# Patient Record
Sex: Female | Born: 1960 | Race: Black or African American | Hispanic: No | Marital: Married | Smoking: Current every day smoker
Health system: Southern US, Community
[De-identification: ages and names within clinical notes are randomized; demographics above are authoritative.]

## PROBLEM LIST (undated history)

## (undated) DIAGNOSIS — I10 Essential (primary) hypertension: Secondary | ICD-10-CM

## (undated) DIAGNOSIS — G629 Polyneuropathy, unspecified: Secondary | ICD-10-CM

## (undated) DIAGNOSIS — G8929 Other chronic pain: Secondary | ICD-10-CM

## (undated) DIAGNOSIS — E119 Type 2 diabetes mellitus without complications: Secondary | ICD-10-CM

## (undated) HISTORY — PX: SHOULDER SURGERY: SHX246

## (undated) HISTORY — PX: CHOLECYSTECTOMY: SHX55

## (undated) HISTORY — PX: LEEP: SHX91

---

## 2017-02-13 ENCOUNTER — Emergency Department (HOSPITAL_BASED_OUTPATIENT_CLINIC_OR_DEPARTMENT_OTHER): Payer: Self-pay

## 2017-02-13 ENCOUNTER — Encounter (HOSPITAL_BASED_OUTPATIENT_CLINIC_OR_DEPARTMENT_OTHER): Payer: Self-pay | Admitting: Emergency Medicine

## 2017-02-13 ENCOUNTER — Observation Stay (HOSPITAL_BASED_OUTPATIENT_CLINIC_OR_DEPARTMENT_OTHER)
Admission: EM | Admit: 2017-02-13 | Discharge: 2017-02-14 | Disposition: A | Discharge status: Home / Self Care | Payer: Self-pay | Attending: Internal Medicine | Admitting: Internal Medicine

## 2017-02-13 DIAGNOSIS — F17200 Nicotine dependence, unspecified, uncomplicated: Secondary | ICD-10-CM | POA: Insufficient documentation

## 2017-02-13 DIAGNOSIS — R062 Wheezing: Secondary | ICD-10-CM

## 2017-02-13 DIAGNOSIS — E119 Type 2 diabetes mellitus without complications: Secondary | ICD-10-CM

## 2017-02-13 DIAGNOSIS — Z79899 Other long term (current) drug therapy: Secondary | ICD-10-CM | POA: Insufficient documentation

## 2017-02-13 DIAGNOSIS — I10 Essential (primary) hypertension: Secondary | ICD-10-CM | POA: Insufficient documentation

## 2017-02-13 DIAGNOSIS — E781 Pure hyperglyceridemia: Secondary | ICD-10-CM | POA: Insufficient documentation

## 2017-02-13 DIAGNOSIS — J209 Acute bronchitis, unspecified: Secondary | ICD-10-CM | POA: Insufficient documentation

## 2017-02-13 DIAGNOSIS — Z791 Long term (current) use of non-steroidal anti-inflammatories (NSAID): Secondary | ICD-10-CM | POA: Insufficient documentation

## 2017-02-13 DIAGNOSIS — E1165 Type 2 diabetes mellitus with hyperglycemia: Principal | ICD-10-CM | POA: Insufficient documentation

## 2017-02-13 DIAGNOSIS — N289 Disorder of kidney and ureter, unspecified: Secondary | ICD-10-CM | POA: Insufficient documentation

## 2017-02-13 DIAGNOSIS — R739 Hyperglycemia, unspecified: Secondary | ICD-10-CM | POA: Diagnosis present

## 2017-02-13 DIAGNOSIS — E1140 Type 2 diabetes mellitus with diabetic neuropathy, unspecified: Secondary | ICD-10-CM | POA: Insufficient documentation

## 2017-02-13 DIAGNOSIS — B3781 Candidal esophagitis: Secondary | ICD-10-CM

## 2017-02-13 DIAGNOSIS — J069 Acute upper respiratory infection, unspecified: Secondary | ICD-10-CM

## 2017-02-13 DIAGNOSIS — E871 Hypo-osmolality and hyponatremia: Secondary | ICD-10-CM | POA: Insufficient documentation

## 2017-02-13 HISTORY — DX: Type 2 diabetes mellitus without complications: E11.9

## 2017-02-13 HISTORY — DX: Polyneuropathy, unspecified: G62.9

## 2017-02-13 HISTORY — DX: Essential (primary) hypertension: I10

## 2017-02-13 LAB — CBG MONITORING, ED
GLUCOSE-CAPILLARY: 576 mg/dL — AB (ref 65–99)
GLUCOSE-CAPILLARY: 466 mg/dL — AB (ref 65–99)
Glucose-Capillary: 565 mg/dL (ref 65–99)

## 2017-02-13 LAB — URINALYSIS, ROUTINE W REFLEX MICROSCOPIC
BILIRUBIN URINE: NEGATIVE
Glucose, UA: >=500 mg/dL — AB
KETONES UR: 15 mg/dL — AB
Leukocytes, UA: NEGATIVE
NITRITE: NEGATIVE
PROTEIN: NEGATIVE mg/dL
Specific Gravity, Urine: <1.005 — ABNORMAL LOW (ref 1.005–1.030)
pH: 5.5 (ref 5.0–8.0)

## 2017-02-13 LAB — URINALYSIS, MICROSCOPIC (REFLEX)

## 2017-02-13 LAB — RAPID STREP SCREEN (MED CTR MEBANE ONLY): Streptococcus, Group A Screen (Direct): NEGATIVE

## 2017-02-13 LAB — CBC WITH DIFFERENTIAL/PLATELET
BASOS PCT: 0 %
Basophils Absolute: 0 10*3/uL (ref 0.0–0.1)
EOS ABS: 0.1 10*3/uL (ref 0.0–0.7)
EOS PCT: 1 %
HCT: 43.2 % (ref 36.0–46.0)
Hemoglobin: 14.5 g/dL (ref 12.0–15.0)
Lymphocytes Relative: 39 %
Lymphs Abs: 3 10*3/uL (ref 0.7–4.0)
MCH: 30.6 pg (ref 26.0–34.0)
MCHC: 33.6 g/dL (ref 30.0–36.0)
MCV: 91.1 fL (ref 78.0–100.0)
MONO ABS: 0.5 10*3/uL (ref 0.1–1.0)
MONOS PCT: 6 %
Neutro Abs: 4.2 10*3/uL (ref 1.7–7.7)
Neutrophils Relative %: 54 %
PLATELETS: 203 10*3/uL (ref 150–400)
RBC: 4.74 MIL/uL (ref 3.87–5.11)
RDW: 12.3 % (ref 11.5–15.5)
WBC: 7.7 10*3/uL (ref 4.0–10.5)

## 2017-02-13 LAB — BASIC METABOLIC PANEL
Anion gap: 15 (ref 5–15)
BUN: 25 mg/dL — AB (ref 6–20)
CO2: 26 mmol/L (ref 22–32)
CREATININE: 1.3 mg/dL — AB (ref 0.44–1.00)
Calcium: 9.7 mg/dL (ref 8.9–10.3)
Chloride: 88 mmol/L — ABNORMAL LOW (ref 101–111)
GFR, EST AFRICAN AMERICAN: 53 mL/min — AB (ref >60)
GFR, EST NON AFRICAN AMERICAN: 45 mL/min — AB (ref >60)
Glucose, Bld: 741 mg/dL (ref 65–99)
Potassium: 4 mmol/L (ref 3.5–5.1)
SODIUM: 129 mmol/L — AB (ref 135–145)

## 2017-02-13 LAB — GLUCOSE, CAPILLARY
GLUCOSE-CAPILLARY: 394 mg/dL — AB (ref 65–99)
GLUCOSE-CAPILLARY: 271 mg/dL — AB (ref 65–99)

## 2017-02-13 LAB — MRSA PCR SCREENING: MRSA BY PCR: NEGATIVE

## 2017-02-13 MED ORDER — AZITHROMYCIN 250 MG PO TABS
500.0000 mg | ORAL_TABLET | Freq: Every day | ORAL | Status: AC
  Administered 2017-02-13: 500 mg via ORAL
  Filled 2017-02-13: qty 2

## 2017-02-13 MED ORDER — ACETAMINOPHEN 500 MG PO TABS
1000.0000 mg | ORAL_TABLET | Freq: Once | ORAL | Status: AC
  Administered 2017-02-13: 1000 mg via ORAL
  Filled 2017-02-13: qty 2

## 2017-02-13 MED ORDER — SODIUM CHLORIDE 0.9 % IV BOLUS (SEPSIS)
1000.0000 mL | Freq: Once | INTRAVENOUS | Status: AC
  Administered 2017-02-13: 1000 mL via INTRAVENOUS

## 2017-02-13 MED ORDER — SODIUM CHLORIDE 0.9 % IV SOLN
INTRAVENOUS | Status: DC
  Administered 2017-02-13: 5.2 [IU]/h via INTRAVENOUS
  Filled 2017-02-13 (×2): qty 1

## 2017-02-13 MED ORDER — CHLORHEXIDINE GLUCONATE 0.12 % MT SOLN
15.0000 mL | Freq: Two times a day (BID) | OROMUCOSAL | Status: DC
  Administered 2017-02-13 – 2017-02-14 (×2): 15 mL via OROMUCOSAL
  Filled 2017-02-13 (×2): qty 15

## 2017-02-13 MED ORDER — AZITHROMYCIN 250 MG PO TABS
250.0000 mg | ORAL_TABLET | Freq: Every day | ORAL | Status: DC
  Administered 2017-02-14: 250 mg via ORAL
  Filled 2017-02-13: qty 1

## 2017-02-13 MED ORDER — ENOXAPARIN SODIUM 40 MG/0.4ML ~~LOC~~ SOLN
40.0000 mg | SUBCUTANEOUS | Status: DC
  Administered 2017-02-13: 40 mg via SUBCUTANEOUS
  Filled 2017-02-13: qty 0.4

## 2017-02-13 MED ORDER — ORAL CARE MOUTH RINSE: 15.0000 mL | Freq: Two times a day (BID) | OROMUCOSAL | Status: DC

## 2017-02-13 MED ORDER — FLUCONAZOLE 100 MG PO TABS: 150.0000 mg | ORAL_TABLET | Freq: Once | ORAL | Status: DC

## 2017-02-13 MED ORDER — HYDROCODONE-ACETAMINOPHEN 7.5-325 MG PO TABS: 1.0000 | ORAL_TABLET | Freq: Four times a day (QID) | ORAL | Status: DC | PRN

## 2017-02-13 MED ORDER — METHYLPREDNISOLONE SODIUM SUCC 125 MG IJ SOLR
125.0000 mg | Freq: Once | INTRAMUSCULAR | Status: AC
  Administered 2017-02-13: 125 mg via INTRAVENOUS
  Filled 2017-02-13: qty 2

## 2017-02-13 MED ORDER — GLIMEPIRIDE 2 MG PO TABS
2.0000 mg | ORAL_TABLET | Freq: Every day | ORAL | Status: DC
  Administered 2017-02-14: 2 mg via ORAL
  Filled 2017-02-13: qty 1

## 2017-02-13 MED ORDER — DEXTROSE-NACL 5-0.45 % IV SOLN: INTRAVENOUS | Status: DC

## 2017-02-13 MED ORDER — SODIUM CHLORIDE 0.9 % IV SOLN
INTRAVENOUS | Status: DC
  Administered 2017-02-13: 20:00:00 via INTRAVENOUS
  Administered 2017-02-14: 125 mL/h via INTRAVENOUS

## 2017-02-13 MED ORDER — ONDANSETRON HCL 4 MG/2ML IJ SOLN
4.0000 mg | INTRAMUSCULAR | Status: AC
  Administered 2017-02-13: 4 mg via INTRAVENOUS
  Filled 2017-02-13: qty 2

## 2017-02-13 MED ORDER — ACETAMINOPHEN 325 MG PO TABS: 650.0000 mg | ORAL_TABLET | Freq: Four times a day (QID) | ORAL | Status: DC | PRN

## 2017-02-13 MED ORDER — GUAIFENESIN-CODEINE 100-10 MG/5ML PO SOLN
10.0000 mL | Freq: Once | ORAL | Status: AC
  Administered 2017-02-13: 10 mL via ORAL
  Filled 2017-02-13: qty 10

## 2017-02-13 MED ORDER — INSULIN ASPART 100 UNIT/ML ~~LOC~~ SOLN
0.0000 [IU] | Freq: Three times a day (TID) | SUBCUTANEOUS | Status: DC
  Administered 2017-02-14: 7 [IU] via SUBCUTANEOUS
  Administered 2017-02-14: 3 [IU] via SUBCUTANEOUS

## 2017-02-13 MED ORDER — INSULIN ASPART 100 UNIT/ML ~~LOC~~ SOLN: 15.0000 [IU] | Freq: Once | SUBCUTANEOUS | Status: DC

## 2017-02-13 MED ORDER — MOMETASONE FURO-FORMOTEROL FUM 200-5 MCG/ACT IN AERO
2.0000 | INHALATION_SPRAY | Freq: Two times a day (BID) | RESPIRATORY_TRACT | Status: DC
  Administered 2017-02-13: 2 via RESPIRATORY_TRACT
  Filled 2017-02-13: qty 8.8

## 2017-02-13 MED ORDER — METHOCARBAMOL 500 MG PO TABS
500.0000 mg | ORAL_TABLET | Freq: Four times a day (QID) | ORAL | Status: DC
  Administered 2017-02-13 – 2017-02-14 (×2): 500 mg via ORAL
  Filled 2017-02-13 (×2): qty 1

## 2017-02-13 MED ORDER — MAGNESIUM SULFATE 2 GM/50ML IV SOLN
2.0000 g | Freq: Once | INTRAVENOUS | Status: AC
  Administered 2017-02-13: 2 g via INTRAVENOUS
  Filled 2017-02-13: qty 50

## 2017-02-13 MED ORDER — GABAPENTIN 300 MG PO CAPS
300.0000 mg | ORAL_CAPSULE | Freq: Three times a day (TID) | ORAL | Status: DC
Start: 2017-02-13 — End: 2017-02-14
  Administered 2017-02-13 – 2017-02-14 (×2): 300 mg via ORAL
  Filled 2017-02-13 (×2): qty 1

## 2017-02-13 MED ORDER — IPRATROPIUM BROMIDE 0.02 % IN SOLN
0.5000 mg | Freq: Once | RESPIRATORY_TRACT | Status: AC
  Administered 2017-02-13: 0.5 mg via RESPIRATORY_TRACT
  Filled 2017-02-13: qty 2.5

## 2017-02-13 MED ORDER — SODIUM CHLORIDE 0.9 % IV SOLN
INTRAVENOUS | Status: DC
  Filled 2017-02-13: qty 1

## 2017-02-13 MED ORDER — ALBUTEROL SULFATE (2.5 MG/3ML) 0.083% IN NEBU
5.0000 mg | INHALATION_SOLUTION | Freq: Once | RESPIRATORY_TRACT | Status: AC
  Administered 2017-02-13: 5 mg via RESPIRATORY_TRACT
  Filled 2017-02-13: qty 6

## 2017-02-13 MED ORDER — ACETAMINOPHEN 650 MG RE SUPP: 650.0000 mg | Freq: Four times a day (QID) | RECTAL | Status: DC | PRN

## 2017-02-13 MED ORDER — ALBUTEROL SULFATE (2.5 MG/3ML) 0.083% IN NEBU
2.5000 mg | INHALATION_SOLUTION | Freq: Four times a day (QID) | RESPIRATORY_TRACT | Status: DC | PRN
Start: 2017-02-13 — End: 2017-02-14

## 2017-02-13 MED ORDER — LISINOPRIL 10 MG PO TABS
10.0000 mg | ORAL_TABLET | Freq: Every day | ORAL | Status: DC
  Administered 2017-02-14: 10 mg via ORAL
  Filled 2017-02-13: qty 1

## 2017-02-13 MED ORDER — METFORMIN HCL ER 500 MG PO TB24
500.0000 mg | ORAL_TABLET | Freq: Every day | ORAL | Status: DC
  Administered 2017-02-14: 500 mg via ORAL
  Filled 2017-02-13: qty 1

## 2017-02-13 NOTE — ED Notes (Signed)
Patient transported to X-ray 

## 2017-02-13 NOTE — ED Notes (Signed)
Glucose 741, results given to ED MD

## 2017-02-13 NOTE — ED Notes (Signed)
Pt on auto VS  

## 2017-02-13 NOTE — H&P (Signed)
TRH H&P   Patient Demographics:    Michelle Hess, is a 56 y.o. female  MRN: [id number]   DOB - 1960/10/25  Admit Date - 02/13/2017  Outpatient Primary MD for the patient is Patient, No Pcp Per  Referring MD/NP/PA: Arthor Captain  Outpatient Specialists: none  Patient [facility] from: [facility]  Chief Complaint  Patient presents with  . Sore Throat  . Cough      HPI:    Michelle Hess  is a 56 y.o. female,  w hx of bronchitis on dulera  Apparently c/o cough w yellow sputum x5 days. And slight dyspnea.  Denies fever, chills, cp, palp, n/v, diarrhea, brbpr.  [facility] to [facility].   In ED,  CXR negative Wbc 7.7, Hgb 14.5,  Bun 25, Creatinine 1.30 Glucose 741 Hco3 26.  ua few ketones 15.   Pt will be admitted for new onset diabetes and bronchitis.      Review of systems:    In addition to the HPI above,  No Fever-chills, No Headache, No changes with Vision or hearing, No problems swallowing food or Liquids, No Chest pain,  No Abdominal pain, No Nausea or Vommitting, Bowel movements are regular, No Blood in stool or Urine, No dysuria, No new skin rashes or bruises, No new joints pains-aches,  No new weakness, tingling, numbness in any extremity, No recent weight gain or loss, No polyuria, polydypsia or polyphagia, No significant Mental Stressors.  A full 10 point Review of Systems was done, except as stated above, all other Review of Systems were negative.   With Past History of the following :    Past Medical History:  Diagnosis Date  . Diabetes (HCC)   . Hypertension   . Neuropathy       History reviewed. No pertinent surgical history.    Social History:     Social History  Substance Use Topics  . Smoking status: Current Every Day Smoker  . Smokeless tobacco: Never Used  . Alcohol use No      Lives -  At home in [place]  Mobility -  Walks by self  Family History :     Family History  Problem Relation Age of Onset  . Pancreatic cancer Mother   . COPD Father       Home Medications:   Prior to Admission medications   Medication Sig Start Date End Date Taking? Authorizing Provider  albuterol (PROVENTIL) (2.5 MG/3ML) 0.083% nebulizer solution Take 2.5 mg by nebulization every 6 (six) hours as needed for wheezing or shortness of breath.   Yes [provider]  budesonide-formoterol (SYMBICORT) 160-4.5 MCG/ACT inhaler Inhale 2 puffs into the lungs 2 (two) times daily.   Yes [provider]  gabapentin (NEURONTIN) 300 MG capsule Take 300 mg by mouth 3 (three) times daily.   Yes [provider]  HYDROcodone-acetaminophen (NORCO) 7.5-325 MG tablet Take 1 tablet by mouth every 6 (six) hours as needed for moderate pain.   Yes [provider]  lisinopril (PRINIVIL,ZESTRIL) 10 MG tablet Take 10 mg by mouth daily.   Yes [provider]  meloxicam (MOBIC) 15 MG tablet Take 15 mg by mouth daily.   Yes [provider]  methocarbamol (ROBAXIN) 500 MG tablet Take 500 mg by mouth 4 (four) times daily.   Yes [provider]     Allergies:    No Known Allergies   Physical Exam:   Vitals  Blood pressure 138/81, pulse 99, temperature 98.5 F (36.9 C), temperature source Oral, resp. rate (!) 22, height  (1.626 m), weight 82.2 kg (181 lb 3.5 oz), SpO2 91 %.   1. General lying in bed in NAD,    2. Normal affect and insight, Not Suicidal or Homicidal, Awake Alert, Oriented X 3.  3. No F.N deficits, ALL C.Nerves Intact, Strength 5/5 all 4 extremities, Sensation intact all 4 extremities, Plantars down going.  4. Ears and Eyes appear Normal, Conjunctivae clear, PERRLA. Moist Oral Mucosa.  5. Supple Neck, No JVD, No cervical lymphadenopathy appriciated, No Carotid Bruits.  6. Symmetrical Chest wall movement, Good air  movement bilaterally, CTAB.  7. RRR, No Gallops, Rubs or Murmurs, No Parasternal Heave.  8. Positive Bowel Sounds, Abdomen Soft, No tenderness, No organomegaly appriciated,No rebound -guarding or rigidity.  9.  No Cyanosis, Normal Skin Turgor, No Skin Rash or Bruise.  10. Good muscle tone,  joints appear normal , no effusions, Normal ROM.  11. No Palpable Lymph Nodes in Neck or Axillae     Data Review:    CBC  Recent Labs Lab 02/13/17 1320  WBC 7.7  HGB 14.5  HCT 43.2  PLT 203  MCV 91.1  MCH 30.6  MCHC 33.6  RDW 12.3  LYMPHSABS 3.0  MONOABS 0.5  EOSABS 0.1  BASOSABS 0.0   ------------------------------------------------------------------------------------------------------------------  Chemistries   Recent Labs Lab 02/13/17 1320  NA 129*  K 4.0  CL 88*  CO2 26  GLUCOSE 741*  BUN 25*  CREATININE 1.30*  CALCIUM 9.7   ------------------------------------------------------------------------------------------------------------------ estimated creatinine clearance is 50.7 mL/min (A) (by C-G formula based on SCr of 1.3 mg/dL (H)). ------------------------------------------------------------------------------------------------------------------ No results for input(s): TSH, T4TOTAL, T3FREE, THYROIDAB in the last 72 hours.  Invalid input(s): FREET3  Coagulation profile No results for input(s): INR, PROTIME in the last 168 hours. ------------------------------------------------------------------------------------------------------------------- No results for input(s): DDIMER in the last 72 hours. -------------------------------------------------------------------------------------------------------------------  Cardiac Enzymes No results for input(s): CKMB, TROPONINI, MYOGLOBIN in the last 168 hours.  Invalid input(s): CK ------------------------------------------------------------------------------------------------------------------ No results found for:  BNP   ---------------------------------------------------------------------------------------------------------------  Urinalysis    Component Value Date/Time   COLORURINE YELLOW 02/13/2017 1440   APPEARANCEUR CLEAR 02/13/2017 1440   LABSPEC <1.005 (L) 02/13/2017 1440   PHURINE 5.5 02/13/2017 1440   GLUCOSEU >=500 (A) 02/13/2017 1440   HGBUR MODERATE (A) 02/13/2017 1440   BILIRUBINUR NEGATIVE 02/13/2017 1440   KETONESUR 15 (A) 02/13/2017 1440   PROTEINUR NEGATIVE 02/13/2017 1440   NITRITE NEGATIVE 02/13/2017 1440   LEUKOCYTESUR NEGATIVE 02/13/2017 1440    ----------------------------------------------------------------------------------------------------------------   Imaging Results:    Dg Chest 2 View  Result Date: 02/13/2017 CLINICAL DATA:  Generalized pain, cough and fever. EXAM: CHEST  2 VIEW COMPARISON:  None. FINDINGS: The heart size and mediastinal contours are within normal limits. Mild atelectasis at the right lung base with elevation of  the right hemidiaphragm. There is no evidence of pulmonary edema, consolidation, pneumothorax, nodule or pleural fluid. The visualized skeletal structures are unremarkable. IMPRESSION: Atelectasis at the right lung base. No active cardiopulmonary disease. Electronically Signed   By: Irish Lack M.D.   On: 02/13/2017 12:32      Assessment & Plan:    Principal Problem:   Hyperglycemia Active Problems:   New onset type 2 diabetes mellitus (HCC)    Hyperglycemia , no DKA New onset Dm2 fsbs q4h, ISS Start amaryl  po qday Will start metformin tomorrow AM  Mild renal insufficiency Hydrate with Ns iv Check cmp in am  Hyponatremia Pseudohyponatremia Will resolve with improvement in sugar.   Bronchitis zpak Continue dulera  DVT Prophylaxis Lovenox - SCDs  AM Labs Ordered, also please review Full Orders  Family Communication: Admission, patients condition and plan of care including tests being ordered have been  discussed with the patient  who indicate understanding and agree with the plan and Code Status.  Code Status FULL CODE  Likely DC to  home  Condition GUARDED    Consults called: none  Admission status:  observation  Time spent in minutes : 45    Pearson Grippe M.D on 02/13/2017 at 7:44 PM  Between 7am to 7pm - Pager - [phone]   After 7pm go to [email or web address] - password [email or web address]  [facility] - Office  [phone]

## 2017-02-13 NOTE — ED Notes (Signed)
Daughter called Raoul Pitch [phone] to inform of room number and transfer, no answer.

## 2017-02-13 NOTE — ED Provider Notes (Signed)
[facility] Provider Note   CSN: [id number] Arrival date & time: 02/13/17  1153     History   Chief Complaint Chief Complaint  Patient presents with  . Sore Throat  . Cough    HPI Michelle Hess is a 56 y.o. female. Presents emergency Department with chief complaint of cough. She states that she has had about 3 days of bodyaches, chills, cough with post tussive vomiting. She complains of wheezing. She ran out of her inhaler 2 days ago. She is a current daily smoker. She has noticed excessive thirst as of late. She states she has been unable to drink or eat much because she has an extremely sore throat.  HPI  Past Medical History:  Diagnosis Date  . Hypertension   . Neuropathy     There are no active problems to display for this patient.   History reviewed. No pertinent surgical history.  OB History    No data available       Home Medications    Prior to Admission medications   Medication Sig Start Date End Date Taking? Authorizing Provider  gabapentin (NEURONTIN) 300 MG capsule Take 300 mg by mouth 3 (three) times daily.   Yes [provider]  HYDROcodone-acetaminophen (NORCO) 7.5-325 MG tablet Take 1 tablet by mouth every 6 (six) hours as needed for moderate pain.   Yes [provider]  lisinopril (PRINIVIL,ZESTRIL) 10 MG tablet Take 10 mg by mouth daily.   Yes [provider]  meloxicam (MOBIC) 15 MG tablet Take 15 mg by mouth daily.   Yes [provider]  methocarbamol (ROBAXIN) 500 MG tablet Take 500 mg by mouth 4 (four) times daily.   Yes [provider]    Family History History reviewed. No pertinent family history.  Social History Social History  Substance Use Topics  . Smoking status: Current Every Day Smoker  . Smokeless tobacco: Never Used  . Alcohol use No     Allergies   Patient has no known allergies.   Review of Systems Review of Systems Ten systems reviewed and are negative  for acute change, except as noted in the HPI.    Physical Exam Updated Vital Signs BP (!) 124/95 (BP Location: Left Arm)   Pulse 91   Temp 98.4 F (36.9 C) (Oral)   Resp 20   Ht  (1.626 m)   Wt 81.6 kg (180 lb)   SpO2 100%   BMI 30.90 kg/m   Physical Exam  Constitutional: She is oriented to person, place, and time. She appears well-developed and well-nourished. No distress.  HENT:  Head: Normocephalic and atraumatic.  Beefy-red, Erythema of the Oropharynx and Throat with White Plaquing.  Eyes: Pupils are equal, round, and reactive to light. Conjunctivae and EOM are normal. No scleral icterus.  Neck: Normal range of motion.  Cardiovascular: Normal rate, regular rhythm and normal heart sounds.  Exam reveals no gallop and no friction rub.   No murmur heard. Pulmonary/Chest: Effort normal and breath sounds normal. No respiratory distress.  Abdominal: Soft. Bowel sounds are normal. She exhibits no distension and no mass. There is no tenderness. There is no guarding.  Neurological: She is alert and oriented to person, place, and time.  Skin: Skin is warm and dry. She is not diaphoretic.  Psychiatric: Her behavior is normal.  Nursing note and vitals reviewed.    ED Treatments / Results  Labs (all labs ordered are listed, but only abnormal results are displayed) Labs  Reviewed  BASIC METABOLIC PANEL - Abnormal; Notable for the following:       Result Value   Sodium 129 (*)    Chloride 88 (*)    Glucose, Bld 741 (*)    BUN 25 (*)    Creatinine, Ser 1.30 (*)    GFR calc non Af Amer 45 (*)    GFR calc Af Amer 53 (*)    All other components within normal limits  CBG MONITORING, ED - Abnormal; Notable for the following:    Glucose-Capillary 576 (*)    All other components within normal limits  RAPID STREP SCREEN (NOT AT [facility])  CULTURE, GROUP A STREP ([facility])  CBC WITH DIFFERENTIAL/PLATELET  URINALYSIS, ROUTINE W REFLEX MICROSCOPIC    EKG  EKG Interpretation None         Radiology Dg Chest 2 View  Result Date: 02/13/2017 CLINICAL DATA:  Generalized pain, cough and fever. EXAM: CHEST  2 VIEW COMPARISON:  None. FINDINGS: The heart size and mediastinal contours are within normal limits. Mild atelectasis at the right lung base with elevation of the right hemidiaphragm. There is no evidence of pulmonary edema, consolidation, pneumothorax, nodule or pleural fluid. The visualized skeletal structures are unremarkable. IMPRESSION: Atelectasis at the right lung base. No active cardiopulmonary disease. Electronically Signed   By: Irish Lack M.D.   On: 02/13/2017 12:32    Procedures Procedures (including critical care time)  Medications Ordered in ED Medications  magnesium sulfate IVPB 2 g 50 mL (2 g Intravenous New Bag/Given 02/13/17 1454)  dextrose 5 %-0.45 % sodium chloride infusion (not administered)  insulin regular (NOVOLIN R,HUMULIN R) 100 Units in sodium chloride 0.9 % 100 mL (1 Units/mL) infusion (not administered)  sodium chloride 0.9 % bolus 1,000 mL (0 mLs Intravenous Stopped 02/13/17 1450)  sodium chloride 0.9 % bolus 1,000 mL (1,000 mLs Intravenous New Bag/Given 02/13/17 1451)  methylPREDNISolone sodium succinate (SOLU-MEDROL) 125 mg/2 mL injection 125 mg (125 mg Intravenous Given 02/13/17 1451)  albuterol (PROVENTIL) (2.5 MG/3ML) 0.083% nebulizer solution 5 mg (5 mg Nebulization Given 02/13/17 1455)  ipratropium (ATROVENT) nebulizer solution 0.5 mg (0.5 mg Nebulization Given 02/13/17 1455)  acetaminophen (TYLENOL) tablet 1,000 mg (1,000 mg Oral Given 02/13/17 1448)  guaiFENesin-codeine 100-10 MG/5ML solution 10 mL (10 mLs Oral Given 02/13/17 1447)  ondansetron (ZOFRAN) injection 4 mg (4 mg Intravenous Given 02/13/17 1451)     Initial Impression / Assessment and Plan / ED Course  I have reviewed the triage vital signs and the nursing notes.  Pertinent labs & imaging results that were available during my care of the patient were reviewed by  me and considered in my medical decision making (see chart for details).     C Debarr is a 56 year old female with new diagnosis of diabetes mellitus here in the emergency department. She was given IV Solu-Medrol for her breathing status prior to getting the elevated blood sugar level. Patient is currently on the glucose stabilizing. No anion gap suggestive of DKA. She also has oral thrush. I have given the patient Diflucan. She is uninsured and a new to the area. She does not have a primary care physician. Social issues, lack of health care access and financial barriers make in-patient work-up essential.    Final Clinical Impressions(s) / ED Diagnoses   Final diagnoses:  Diabetes mellitus, new onset (HCC)  Candidal esophagitis (HCC)  Upper respiratory tract infection, unspecified type  Wheezing    New Prescriptions New Prescriptions   No medications on  file     Arthor Captain, PA-C 02/13/17 1658    Lavera Guise, MD 02/13/17 [phone]

## 2017-02-13 NOTE — ED Notes (Signed)
ED Provider at bedside. 

## 2017-02-13 NOTE — ED Triage Notes (Signed)
Patient reports that she has had generalized pain and cough with fever x 2 -3 days. Also reports that she is Nauseated, she states that sputum at the back of her throat is causing her to gag and she is unable to take her medications. Patient states that she is very jittery as well

## 2017-02-14 DIAGNOSIS — R739 Hyperglycemia, unspecified: Secondary | ICD-10-CM

## 2017-02-14 LAB — CBC
HEMATOCRIT: 39.9 % (ref 36.0–46.0)
HEMOGLOBIN: 13.8 g/dL (ref 12.0–15.0)
MCH: 31.1 pg (ref 26.0–34.0)
MCHC: 34.6 g/dL (ref 30.0–36.0)
MCV: 89.9 fL (ref 78.0–100.0)
Platelets: 206 10*3/uL (ref 150–400)
RBC: 4.44 MIL/uL (ref 3.87–5.11)
RDW: 12.5 % (ref 11.5–15.5)
WBC: 11.6 10*3/uL — ABNORMAL HIGH (ref 4.0–10.5)

## 2017-02-14 LAB — HEMOGLOBIN A1C
HEMOGLOBIN A1C: 11 % — AB (ref 4.8–5.6)
MEAN PLASMA GLUCOSE: 269 mg/dL

## 2017-02-14 LAB — COMPREHENSIVE METABOLIC PANEL
ALBUMIN: 3.8 g/dL (ref 3.5–5.0)
ALT: 20 U/L (ref 14–54)
ANION GAP: 16 — AB (ref 5–15)
AST: 20 U/L (ref 15–41)
Alkaline Phosphatase: 100 U/L (ref 38–126)
BUN: 21 mg/dL — ABNORMAL HIGH (ref 6–20)
CO2: 20 mmol/L — AB (ref 22–32)
Calcium: 8.9 mg/dL (ref 8.9–10.3)
Chloride: 99 mmol/L — ABNORMAL LOW (ref 101–111)
Creatinine, Ser: 1.07 mg/dL — ABNORMAL HIGH (ref 0.44–1.00)
GFR calc non Af Amer: 57 mL/min — ABNORMAL LOW (ref >60)
GLUCOSE: 397 mg/dL — AB (ref 65–99)
POTASSIUM: 4.7 mmol/L (ref 3.5–5.1)
SODIUM: 135 mmol/L (ref 135–145)
TOTAL PROTEIN: 7.6 g/dL (ref 6.5–8.1)
Total Bilirubin: 0.9 mg/dL (ref 0.3–1.2)

## 2017-02-14 LAB — LIPID PANEL
CHOL/HDL RATIO: 7.8 ratio
CHOLESTEROL: 217 mg/dL — AB (ref 0–200)
HDL: 28 mg/dL — AB (ref >40)
LDL Cholesterol: UNDETERMINED mg/dL (ref 0–99)
Triglycerides: 567 mg/dL — ABNORMAL HIGH (ref <150)
VLDL: UNDETERMINED mg/dL (ref 0–40)

## 2017-02-14 LAB — GLUCOSE, CAPILLARY
GLUCOSE-CAPILLARY: 404 mg/dL — AB (ref 65–99)
GLUCOSE-CAPILLARY: 316 mg/dL — AB (ref 65–99)
Glucose-Capillary: 240 mg/dL — ABNORMAL HIGH (ref 65–99)

## 2017-02-14 LAB — HIV ANTIBODY (ROUTINE TESTING W REFLEX): HIV Screen 4th Generation wRfx: NONREACTIVE

## 2017-02-14 LAB — TSH: TSH: 0.278 u[IU]/mL — ABNORMAL LOW (ref 0.350–4.500)

## 2017-02-14 MED ORDER — OMEGA-3-ACID ETHYL ESTERS 1 G PO CAPS: 1.0000 g | ORAL_CAPSULE | Freq: Two times a day (BID) | ORAL | 0 refills | Status: DC

## 2017-02-14 MED ORDER — AZITHROMYCIN 250 MG PO TABS: 250.0000 mg | ORAL_TABLET | Freq: Every day | ORAL | 0 refills | Status: AC

## 2017-02-14 MED ORDER — INSULIN GLARGINE 100 UNITS/ML SOLOSTAR PEN: 10.0000 [IU] | PEN_INJECTOR | Freq: Every day | SUBCUTANEOUS | 0 refills | Status: DC

## 2017-02-14 MED ORDER — INSULIN ASPART 100 UNIT/ML ~~LOC~~ SOLN
10.0000 [IU] | Freq: Once | SUBCUTANEOUS | Status: AC
  Administered 2017-02-14: 10 [IU] via SUBCUTANEOUS

## 2017-02-14 MED ORDER — LIVING WELL WITH DIABETES BOOK
Freq: Once | Status: AC
  Administered 2017-02-14: 14:00:00
  Filled 2017-02-14: qty 1

## 2017-02-14 MED ORDER — BLOOD GLUCOSE MONITOR KIT: PACK | 0 refills | Status: DC

## 2017-02-14 MED ORDER — INSULIN GLARGINE 100 UNIT/ML ~~LOC~~ SOLN
10.0000 [IU] | Freq: Every day | SUBCUTANEOUS | Status: DC
  Administered 2017-02-14: 10 [IU] via SUBCUTANEOUS
  Filled 2017-02-14: qty 0.1

## 2017-02-14 MED FILL — OMEGA-3 ETHYL ESTERS 1 GM C: 1 | 30 days supply | Qty: 120 | Fill #0

## 2017-02-14 MED FILL — LANTUS 100 UNITS/ML VIAL: 100 | 28 days supply | Qty: 10 | Fill #0

## 2017-02-14 MED FILL — AZITHROMYCIN 250 MG TAB: 250 | 4 days supply | Qty: 4 | Fill #0

## 2017-02-14 NOTE — Discharge Summary (Signed)
Physician Discharge Summary  Michelle Hess NIO:[id number] DOB: 04-16-1961 DOA: 02/13/2017  PCP: Patient, No Pcp Per  Admit date: 02/13/2017 Discharge date: 02/14/2017  Recommendations for Outpatient Follow-up:   Please start taking insulin 10 units daily  Before a meal (preprandial plasma glucose): 70-130   1-2 hours after beginning of the meal (Postprandial plasma glucose): Less than 180    See more at: www.diabetes.org/living-with-diabetes/treatment-and-care/blood-glucose-control/checking-your-blood-glucose.htm  Your A1c is 11 indicating poor glycemic control for which reason insulin is recommended. Follow up with PCP in regards to presenting your CBG logs.  Take azithromycin for bronchitis for 4 more days on discharge.    Discharge Diagnoses:  Principal Problem:   Hyperglycemia Active Problems:   New onset type 2 diabetes mellitus ([place])   Diabetes mellitus, new onset ([place])   Upper respiratory tract infection    Discharge Condition: stable   Diet recommendation: as tolerated   History of present illness:   56 y.o. female,  w hx of bronchitis on dulera  Apparently c/o cough w yellow sputum x5 days. And slight dyspnea.  Denies fever, chills, cp, palp, n/v, diarrhea, brbpr.  Pt went to [place] for evaluation. CXR was negative on admission. Glucose was 741.  Hospital Course:   New onset diabetes, uncontrolled - A1c 11 - Per DM coordinator pt will take Lantus 10 units Q 24 hours, prescription provided - RN to provide education on self administering insulin  Acute bronchitis / Cough - Continue azithromycin for 4 days  Hypertriglyceridemia - Due to excess calories - Advised on diet - Unable to calculate LDL due to high triglyceride levels - Start omega 3 supplementation   Low TSH - TSH 0.278 - Spoke with pt about rechecking TSH on outpt setting    Signed:  Leisa Lenz, MD  [facility] 02/14/2017, 11:45 AM  Pager #:  [phone]  Time spent in minutes: more than 30 minutes  Procedures:  None   Consultations:  DM coordinator   Discharge Exam: Vitals:   02/14/17 0900 02/14/17 0912  BP: (!) 89/56 139/81  Pulse:    Resp: 15 13  Temp:    SpO2: 94% 97%   Vitals:   02/14/17 0700 02/14/17 0800 02/14/17 0900 02/14/17 0912  BP: (!) 184/84 124/78 (!) 89/56 139/81  Pulse:      Resp: _0 Temp:  98.1 F (36.7 C)    TempSrc:  Oral    SpO2: 96% 96% 94% 97%  Weight:      Height:        General: Pt is alert, follows commands appropriately, not in acute distress Cardiovascular: Regular rate and rhythm, S1/S2 +, no murmurs Respiratory: Clear to auscultation bilaterally, no wheezing, no crackles, no rhonchi Abdominal: Soft, non tender, non distended, bowel sounds +, no guarding Extremities: no edema, no cyanosis, pulses palpable bilaterally DP and PT Neuro: Grossly nonfocal  Discharge Instructions  Discharge Instructions    Ambulatory referral to Nutrition and Diabetic Education    Complete by:  As directed    Call MD for:  persistant nausea and vomiting    Complete by:  As directed    Call MD for:  severe uncontrolled pain    Complete by:  As directed    Diet - low sodium heart healthy    Complete by:  As directed    Discharge instructions    Complete by:  As directed    Please start taking insulin 10 units daily  Before a meal (preprandial  plasma glucose): 70-130   1-2 hours after beginning of the meal (Postprandial plasma glucose): Less than 180    See more at: www.diabetes.org/living-with-diabetes/treatment-and-care/blood-glucose-control/checking-your-blood-glucose.htm  Your A1c is 11 indicating poor glycemic control for which reason insulin is recommended. Follow up with PCP in regards to presenting your CBG logs.  Take azithromycin for bronchitis for 4 more days on discharge.   Increase activity slowly    Complete by:  As directed      Allergies as of 02/14/2017    No Known Allergies     Medication List    TAKE these medications   albuterol (2.5 MG/3ML) 0.083% nebulizer solution Commonly known as:  PROVENTIL Take 2.5 mg by nebulization every 6 (six) hours as needed for wheezing or shortness of breath.   azithromycin 250 MG tablet Commonly known as:  ZITHROMAX Take 1 tablet (250 mg total) by mouth daily.   blood glucose meter kit and supplies Kit Dispense based on patient and insurance preference. Use up to four times daily as directed. (FOR ICD-9 250.00, 250.01).   budesonide-formoterol 160-4.5 MCG/ACT inhaler Commonly known as:  SYMBICORT Inhale 2 puffs into the lungs 2 (two) times daily.   gabapentin 300 MG capsule Commonly known as:  NEURONTIN Take 300 mg by mouth 3 (three) times daily.   HYDROcodone-acetaminophen 7.5-325 MG tablet Commonly known as:  NORCO Take 1 tablet by mouth every 6 (six) hours as needed for moderate pain.   insulin glargine 100 unit/mL Sopn Commonly known as:  LANTUS Inject 0.1 mLs (10 Units total) into the skin at bedtime.   lisinopril 10 MG tablet Commonly known as:  PRINIVIL,ZESTRIL Take 10 mg by mouth daily.   meloxicam 15 MG tablet Commonly known as:  MOBIC Take 15 mg by mouth daily.   methocarbamol 500 MG tablet Commonly known as:  ROBAXIN Take 500 mg by mouth 4 (four) times daily.   omega-3 acid ethyl esters 1 g capsule Commonly known as:  LOVAZA Take 1 capsule (1 g total) by mouth 2 (two) times daily.          The results of significant diagnostics from this hospitalization (including imaging, microbiology, ancillary and laboratory) are listed below for reference.    Significant Diagnostic Studies: Dg Chest 2 View  Result Date: 02/13/2017 CLINICAL DATA:  Generalized pain, cough and fever. EXAM: CHEST  2 VIEW COMPARISON:  None. FINDINGS: The heart size and mediastinal contours are within normal limits. Mild atelectasis at the right lung base with elevation of the right  hemidiaphragm. There is no evidence of pulmonary edema, consolidation, pneumothorax, nodule or pleural fluid. The visualized skeletal structures are unremarkable. IMPRESSION: Atelectasis at the right lung base. No active cardiopulmonary disease. Electronically Signed   By: Aletta Edouard M.D.   On: 02/13/2017 12:32    Microbiology: Recent Results (from the past 240 hour(s))  Rapid strep screen     Status: None   Collection Time: 02/13/17 12:10 PM  Result Value Ref Range Status   Streptococcus, Group A Screen (Direct) NEGATIVE NEGATIVE Final    Comment: (NOTE) A Rapid Antigen test may result negative if the antigen level in the sample is below the detection level of this test. The FDA has not cleared this test as a stand-alone test therefore the rapid antigen negative result has reflexed to a Group A Strep culture.   MRSA PCR Screening     Status: None   Collection Time: 02/13/17  6:30 PM  Result Value Ref Range Status   MRSA  by PCR NEGATIVE NEGATIVE Final    Comment:        The GeneXpert MRSA Assay (FDA approved for NASAL specimens only), is one component of a comprehensive MRSA colonization surveillance program. It is not intended to diagnose MRSA infection nor to guide or monitor treatment for MRSA infections.      Labs: Basic Metabolic Panel:  Recent Labs Lab 02/13/17 1320 02/14/17 0412  NA 129* 135  K 4.0 4.7  CL 88* 99*  CO2 26 20*  GLUCOSE 741* 397*  BUN 25* 21*  CREATININE 1.30* 1.07*  CALCIUM 9.7 8.9   Liver Function Tests:  Recent Labs Lab 02/14/17 0412  AST 20  ALT 20  ALKPHOS 100  BILITOT 0.9  PROT 7.6  ALBUMIN 3.8   No results for input(s): LIPASE, AMYLASE in the last 168 hours. No results for input(s): AMMONIA in the last 168 hours. CBC:  Recent Labs Lab 02/13/17 1320 02/14/17 0412  WBC 7.7 11.6*  NEUTROABS 4.2  --   HGB 14.5 13.8  HCT 43.2 39.9  MCV 91.1 89.9  PLT 203 206   Cardiac Enzymes: No results for input(s): CKTOTAL,  CKMB, CKMBINDEX, TROPONINI in the last 168 hours. BNP: BNP (last 3 results) No results for input(s): BNP in the last 8760 hours.  ProBNP (last 3 results) No results for input(s): PROBNP in the last 8760 hours.  CBG:  Recent Labs Lab 02/13/17 1506 02/13/17 1635 02/13/17 1742 02/13/17 1845 02/13/17 1949  GLUCAP 576* 565* 466* 394* 271*

## 2017-02-14 NOTE — Progress Notes (Signed)
Inpatient Diabetes Program Recommendations  AACE/ADA: New Consensus Statement on Inpatient Glycemic Control (2015)  Target Ranges:  Prepandial:   less than 140 mg/dL      Peak postprandial:   less than 180 mg/dL (1-2 hours)      Critically ill patients:  140 - 180 mg/dL   Lab Results  Component Value Date   GLUCAP 271 (H) 02/13/2017    Review of Glycemic Control  Diabetes history: New-onset Outpatient Diabetes medications: N/A Current orders for Inpatient glycemic control: Amaryl 2 mg QD, Novolog 0-9 units tidwc  Spoke with pt regarding her new diagnosis of diabetes. Pt states she has been thirsty, tired, having blurred vision, over the past week or two. Pt smokes and states she wants to go outside to smoke. Explained policy of no smoking on [facility] campuses.  Discussed basic patho, homecare, monitoring blood sugars, healthy diet, exercise and f/u with a PCP. Pt states she moved here from [place] and has not obtained PCP. No insurance at present. States she will be covered by her husband's insurance next month. Discussed impact of nutrition, exercise, stress, sickness, and medications on diabetes control. Reviewed Living Well with diabetes booklet and encouraged patient to read through entire book.  Blood sugars still running > 180 mg/dL. Needs basal insulin.  Inpatient Diabetes Program Recommendations:   Add Lantus 10 units Q24H. Increase Novolog to 0-15 units tidwc and hs. If post-prandials > 180 mg/dL, add meal coverage insulin.  Discussed with RN.  Thank you. Ailene Ards, RD, LDN, CDE Inpatient Diabetes Coordinator [phone]

## 2017-02-14 NOTE — Progress Notes (Signed)
February 14, 2017 Chart and discharge orders researched for Case Management needs. Medictrion assistance through the match program for lantus done/phones and addresses of threee outpt medical clinics for follow up Patient and family have no further questions. Marcelle Smiling, BSN, [place], [place] [phone].

## 2017-02-14 NOTE — Progress Notes (Signed)
Patient received discharge instructions and verbalized understanding. Diabetes book given to patient. Patient also did return demonstration on Insulin administration and has no questions.PIVs d/cd intact X2. Patient taken to private vehicle with no complaints at the current time.

## 2017-02-14 NOTE — Care Management Note (Signed)
Case Management Note  Patient Details  Name: Michelle Hess MRN: [id number] Date of Birth: 26-Apr-1961  Subjective/Objective:                  Resp. Infection and sepsis  Action/Plan: Date:  October 15,2 018 Chart reviewed for concurrent status and case management needs.  Will continue to follow patient progress.  Discharge Planning: following for needs  Expected discharge date: [id number]  Marcelle Smiling, BSN, [place], [place]   [phone]   Expected Discharge Date:                  Expected Discharge Plan:  Home/Self Care  In-House Referral:     Discharge planning Services  CM Consult  Post Acute Care Choice:    Choice offered to:     DME Arranged:    DME Agency:     HH Arranged:    HH Agency:     Status of Service:  In process, will continue to follow  If discussed at Long Length of Stay Meetings, dates discussed:    Additional Comments:  [name], RN 02/14/2017, 8:49 AM

## 2017-02-14 NOTE — Discharge Instructions (Signed)
Blood Glucose Monitoring, Adult °Monitoring your blood sugar (glucose) helps you manage your diabetes. It also helps you and your health care provider determine how well your diabetes management plan is working. Blood glucose monitoring involves checking your blood glucose as often as directed, and keeping a record (log) of your results over time. °Why should I monitor my blood glucose? °Checking your blood glucose regularly can: °· Help you understand how food, exercise, illnesses, and medicines affect your blood glucose. °· Let you know what your blood glucose is at any time. You can quickly tell if you are having low blood glucose (hypoglycemia) or high blood glucose (hyperglycemia). °· Help you and your health care provider adjust your medicines as needed. ° °When should I check my blood glucose? °Follow instructions from your health care provider about how often to check your blood glucose. This may depend on: °· The type of diabetes you have. °· How well-controlled your diabetes is. °· Medicines you are taking. ° °If you have type 1 diabetes: °· Check your blood glucose at least 2 times a day. °· Also check your blood glucose: °? Before every insulin injection. °? Before and after exercise. °? Between meals. °? 2 hours after a meal. °? Occasionally between 2:00 a.m. and 3:00 a.m., as directed. °? Before potentially dangerous tasks, like driving or using heavy machinery. °? At bedtime. °· You may need to check your blood glucose more often, up to 6-10 times a day: °? If you use an insulin pump. °? If you need multiple daily injections (MDI). °? If your diabetes is not well-controlled. °? If you are ill. °? If you have a history of severe hypoglycemia. °? If you have a history of not knowing when your blood glucose is getting low (hypoglycemia unawareness). °If you have type 2 diabetes: °· If you take insulin or other diabetes medicines, check your blood glucose at least 2 times a day. °· If you are on intensive  insulin therapy, check your blood glucose at least 4 times a day. Occasionally, you may also need to check between 2:00 a.m. and 3:00 a.m., as directed. °· Also check your blood glucose: °? Before and after exercise. °? Before potentially dangerous tasks, like driving or using heavy machinery. °· You may need to check your blood glucose more often if: °? Your medicine is being adjusted. °? Your diabetes is not well-controlled. °? You are ill. °What is a blood glucose log? °· A blood glucose log is a record of your blood glucose readings. It helps you and your health care provider: °? Look for patterns in your blood glucose over time. °? Adjust your diabetes management plan as needed. °· Every time you check your blood glucose, write down your result and notes about things that may be affecting your blood glucose, such as your diet and exercise for the day. °· Most glucose meters store a record of glucose readings in the meter. Some meters allow you to download your records to a computer. °How do I check my blood glucose? °Follow these steps to get accurate readings of your blood glucose: °Supplies needed ° °· Blood glucose meter. °· Test strips for your meter. Each meter has its own strips. You must use the strips that come with your meter. °· A needle to prick your finger (lancet). Do not use lancets more than once. °· A device that holds the lancet (lancing device). °· A journal or log book to write down your results. °Procedure °·   Wash your hands with soap and water.  Prick the side of your finger (not the tip) with the lancet. Use a different finger each time.  Gently rub the finger until a small drop of blood appears.  Follow instructions that come with your meter for inserting the test strip, applying blood to the strip, and using your blood glucose meter.  Write down your result and any notes. Alternative testing sites  Some meters allow you to use areas of your body other than your finger  (alternative sites) to test your blood.  If you think you may have hypoglycemia, or if you have hypoglycemia unawareness, do not use alternative sites. Use your finger instead.  Alternative sites may not be as accurate as the fingers, because blood flow is slower in these areas. This means that the result you get may be delayed, and it may be different from the result that you would get from your finger.  The most common alternative sites are: ? Forearm. ? Thigh. ? Palm of the hand. Additional tips  Always keep your supplies with you.  If you have questions or need help, all blood glucose meters have a 24-hour hotline number that you can call. You may also contact your health care provider.  After you use a few boxes of test strips, adjust (calibrate) your blood glucose meter by following instructions that came with your meter. This information is not intended to replace advice given to you by your health care provider. Make sure you discuss any questions you have with your health care provider. Document Released: 04/22/2003 Document Revised: 11/07/2015 Document Reviewed: 09/29/2015 Elsevier Interactive Patient Education  2017 Elsevier Inc.  Carbohydrate Counting for Diabetes Mellitus, Adult Carbohydrate counting is a method for keeping track of how many carbohydrates you eat. Eating carbohydrates naturally increases the amount of sugar (glucose) in the blood. Counting how many carbohydrates you eat helps keep your blood glucose within normal limits, which helps you manage your diabetes (diabetes mellitus). It is important to know how many carbohydrates you can safely have in each meal. This is different for every person. A diet and nutrition specialist (registered dietitian) can help you make a meal plan and calculate how many carbohydrates you should have at each meal and snack. Carbohydrates are found in the following foods:  Grains, such as breads and cereals.  Dried beans and soy  products.  Starchy vegetables, such as potatoes, peas, and corn.  Fruit and fruit juices.  Milk and yogurt.  Sweets and snack foods, such as cake, cookies, candy, chips, and soft drinks.  How do I count carbohydrates? There are two ways to count carbohydrates in food. You can use either of the methods or a combination of both. Reading "Nutrition Facts" on packaged food The "Nutrition Facts" list is included on the labels of almost all packaged foods and beverages in the [place] It includes:  The serving size.  Information about nutrients in each serving, including the grams (g) of carbohydrate per serving.  To use the Nutrition Facts":  Decide how many servings you will have.  Multiply the number of servings by the number of carbohydrates per serving.  The resulting number is the total amount of carbohydrates that you will be having.  Learning standard serving sizes of other foods When you eat foods containing carbohydrates that are not packaged or do not include "Nutrition Facts" on the label, you need to measure the servings in order to count the amount of carbohydrates:  Measure  the foods that you will eat with a food scale or measuring cup, if needed.  Decide how many standard-size servings you will eat.  Multiply the number of servings by 15. Most carbohydrate-rich foods have about 15 g of carbohydrates per serving. ? For example, if you eat 8 oz (170 g) of strawberries, you will have eaten 2 servings and 30 g of carbohydrates (2 servings x 15 g = 30 g).  For foods that have more than one food mixed, such as soups and casseroles, you must count the carbohydrates in each food that is included.  The following list contains standard serving sizes of common carbohydrate-rich foods. Each of these servings has about 15 g of carbohydrates:   hamburger bun or  English muffin.   oz (15 mL) syrup.   oz (14 g) jelly.  1 slice of bread.  1 six-inch tortilla.  3 oz (85 g)  cooked rice or pasta.  4 oz (113 g) cooked dried beans.  4 oz (113 g) starchy vegetable, such as peas, corn, or potatoes.  4 oz (113 g) hot cereal.  4 oz (113 g) mashed potatoes or  of a large baked potato.  4 oz (113 g) canned or frozen fruit.  4 oz (120 mL) fruit juice.  4-6 crackers.  6 chicken nuggets.  6 oz (170 g) unsweetened dry cereal.  6 oz (170 g) plain fat-free yogurt or yogurt sweetened with artificial sweeteners.  8 oz (240 mL) milk.  8 oz (170 g) fresh fruit or one small piece of fruit.  24 oz (680 g) popped popcorn.  Example of carbohydrate counting Sample meal  3 oz (85 g) chicken breast.  6 oz (170 g) brown rice.  4 oz (113 g) corn.  8 oz (240 mL) milk.  8 oz (170 g) strawberries with sugar-free whipped topping. Carbohydrate calculation 1. Identify the foods that contain carbohydrates: ? Rice. ? Corn. ? Milk. ? Strawberries. 2. Calculate how many servings you have of each food: ? 2 servings rice. ? 1 serving corn. ? 1 serving milk. ? 1 serving strawberries. 3. Multiply each number of servings by 15 g: ? 2 servings rice x 15 g = 30 g. ? 1 serving corn x 15 g = 15 g. ? 1 serving milk x 15 g = 15 g. ? 1 serving strawberries x 15 g = 15 g. 4. Add together all of the amounts to find the total grams of carbohydrates eaten: ? 30 g + 15 g + 15 g + 15 g = 75 g of carbohydrates total. This information is not intended to replace advice given to you by your health care provider. Make sure you discuss any questions you have with your health care provider. Document Released: 04/19/2005 Document Revised: 11/07/2015 Document Reviewed: 10/01/2015 Elsevier Interactive Patient Education  [employer].

## 2017-02-16 LAB — CULTURE, GROUP A STREP (THRC)

## 2017-08-01 ENCOUNTER — Other Ambulatory Visit: Payer: Self-pay

## 2017-08-01 ENCOUNTER — Emergency Department (HOSPITAL_BASED_OUTPATIENT_CLINIC_OR_DEPARTMENT_OTHER): Payer: Self-pay

## 2017-08-01 ENCOUNTER — Encounter (HOSPITAL_BASED_OUTPATIENT_CLINIC_OR_DEPARTMENT_OTHER): Payer: Self-pay

## 2017-08-01 ENCOUNTER — Emergency Department (HOSPITAL_BASED_OUTPATIENT_CLINIC_OR_DEPARTMENT_OTHER)
Admission: EM | Admit: 2017-08-01 | Discharge: 2017-08-01 | Disposition: A | Discharge status: Home / Self Care | Payer: Self-pay | Attending: Emergency Medicine | Admitting: Emergency Medicine

## 2017-08-01 DIAGNOSIS — E119 Type 2 diabetes mellitus without complications: Secondary | ICD-10-CM | POA: Insufficient documentation

## 2017-08-01 DIAGNOSIS — Y9289 Other specified places as the place of occurrence of the external cause: Secondary | ICD-10-CM | POA: Insufficient documentation

## 2017-08-01 DIAGNOSIS — Y999 Unspecified external cause status: Secondary | ICD-10-CM | POA: Insufficient documentation

## 2017-08-01 DIAGNOSIS — W108XXA Fall (on) (from) other stairs and steps, initial encounter: Secondary | ICD-10-CM | POA: Insufficient documentation

## 2017-08-01 DIAGNOSIS — Z79899 Other long term (current) drug therapy: Secondary | ICD-10-CM | POA: Insufficient documentation

## 2017-08-01 DIAGNOSIS — W19XXXA Unspecified fall, initial encounter: Secondary | ICD-10-CM

## 2017-08-01 DIAGNOSIS — I10 Essential (primary) hypertension: Secondary | ICD-10-CM | POA: Insufficient documentation

## 2017-08-01 DIAGNOSIS — F17200 Nicotine dependence, unspecified, uncomplicated: Secondary | ICD-10-CM | POA: Insufficient documentation

## 2017-08-01 DIAGNOSIS — Z794 Long term (current) use of insulin: Secondary | ICD-10-CM | POA: Insufficient documentation

## 2017-08-01 DIAGNOSIS — S83412A Sprain of medial collateral ligament of left knee, initial encounter: Secondary | ICD-10-CM | POA: Insufficient documentation

## 2017-08-01 DIAGNOSIS — Y9389 Activity, other specified: Secondary | ICD-10-CM | POA: Insufficient documentation

## 2017-08-01 MED ORDER — CYCLOBENZAPRINE HCL 10 MG PO TABS: 10.0000 mg | ORAL_TABLET | Freq: Two times a day (BID) | ORAL | 0 refills | Status: DC | PRN

## 2017-08-01 MED ORDER — TRAMADOL HCL 50 MG PO TABS: 50.0000 mg | ORAL_TABLET | Freq: Four times a day (QID) | ORAL | 0 refills | Status: DC | PRN

## 2017-08-01 MED ORDER — OXYCODONE-ACETAMINOPHEN 5-325 MG PO TABS
1.0000 | ORAL_TABLET | Freq: Once | ORAL | Status: AC
  Administered 2017-08-01: 1 via ORAL
  Filled 2017-08-01: qty 1

## 2017-08-01 MED ORDER — MELOXICAM 15 MG PO TABS: 15.0000 mg | ORAL_TABLET | Freq: Every day | ORAL | 0 refills | Status: DC

## 2017-08-01 NOTE — ED Triage Notes (Addendum)
Fell down 7 carpeted steps approx 1045am-pain to left LE "from my knee all the way down to my foot", lower back-denies head injury and no LOC-to triage in w/c-states most of pain is at knee when she stands-agreeable to knee xray only at this time

## 2017-08-01 NOTE — ED Notes (Signed)
ED Provider at bedside. 

## 2017-08-01 NOTE — ED Provider Notes (Signed)
[facility] EMERGENCY DEPARTMENT Provider Note   CSN: [id number] Arrival date & time: 08/01/17  1257     History   Chief Complaint Chief Complaint  Patient presents with  . Fall    HPI Michelle Hess is a 57 y.o. female.  HPI Michelle Hess is a 57 y.o. female with history of diabetes, hypertension, presents to emergency department with complaint of left knee injury.  Patient states she missed a step and fell early this morning, tumbling down 7 steps.  She did not hit her head or lost consciousness.  She states that her left knee bent backwards and she fell on it.  She denies pain anywhere else.  She has not taken anything for pain prior to coming in.  She did apply heat to it.  She states she is unable to move it or bear any weight.  No numbness or tingling distal to the injury.  Past Medical History:  Diagnosis Date  . Diabetes ([place])   . Hypertension   . Neuropathy     Patient Active Problem List   Diagnosis Date Noted  . New onset type 2 diabetes mellitus ([place]) 02/13/2017  . Hyperglycemia 02/13/2017  . Diabetes mellitus, new onset ([place])   . Upper respiratory tract infection     Past Surgical History:  Procedure Laterality Date  . LEEP       OB History   None      Home Medications    Prior to Admission medications   Medication Sig Start Date End Date Taking? Authorizing Provider  albuterol (PROVENTIL) (2.5 MG/3ML) 0.083% nebulizer solution Take 2.5 mg by nebulization every 6 (six) hours as needed for wheezing or shortness of breath.    [provider]  blood glucose meter kit and supplies KIT Dispense based on patient and insurance preference. Use up to four times daily as directed. (FOR ICD-9 250.00, 250.01). 02/14/17   Robbie Lis, MD  budesonide-formoterol [facility]) 160-4.5 MCG/ACT inhaler Inhale 2 puffs into the lungs 2 (two) times daily.    [provider]  gabapentin (NEURONTIN) 300 MG capsule Take 300 mg by mouth 3  (three) times daily.    [provider]  HYDROcodone-acetaminophen (NORCO) 7.5-325 MG tablet Take 1 tablet by mouth every 6 (six) hours as needed for moderate pain.    [provider]  insulin glargine (LANTUS) 100 unit/mL SOPN Inject 0.1 mLs (10 Units total) into the skin at bedtime. 02/14/17   Robbie Lis, MD  lisinopril (PRINIVIL,ZESTRIL) 10 MG tablet Take 10 mg by mouth daily.    [provider]  meloxicam (MOBIC) 15 MG tablet Take 15 mg by mouth daily.    [provider]  methocarbamol (ROBAXIN) 500 MG tablet Take 500 mg by mouth 4 (four) times daily.    [provider]  omega-3 acid ethyl esters (LOVAZA) 1 g capsule Take 1 capsule (1 g total) by mouth 2 (two) times daily. 02/14/17   Robbie Lis, MD    Family History Family History  Problem Relation Age of Onset  . Pancreatic cancer Mother   . COPD Father     Social History Social History   Tobacco Use  . Smoking status: Current Every Day Smoker  . Smokeless tobacco: Never Used  Substance Use Topics  . Alcohol use: No  . Drug use: No     Allergies   Patient has no known allergies.   Review of Systems Review of Systems  Constitutional: Negative for  chills and fever.  Respiratory: Negative for cough, chest tightness and shortness of breath.   Cardiovascular: Negative for chest pain, palpitations and leg swelling.  Gastrointestinal: Negative for abdominal pain, diarrhea, nausea and vomiting.  Musculoskeletal: Positive for arthralgias and joint swelling. Negative for myalgias, neck pain and neck stiffness.  Skin: Negative for rash.  Neurological: Negative for dizziness, weakness and headaches.  All other systems reviewed and are negative.    Physical Exam Updated Vital Signs BP 137/73   Pulse 89   Temp 99.6 F (37.6 C) (Oral)   Resp 20   Ht 5' 4"  (1.626 m)   Wt 83 kg (183 lb)   SpO2 100%   BMI 31.41 kg/m   Physical Exam  Constitutional: She appears  well-developed and well-nourished. No distress.  HENT:  Head: Normocephalic.  Eyes: Conjunctivae are normal.  Neck: Neck supple.  Cardiovascular: Normal rate, regular rhythm and normal heart sounds.  Pulmonary/Chest: Effort normal and breath sounds normal. No respiratory distress. She has no wheezes. She has no rales.  Musculoskeletal: She exhibits no edema.  Significant swelling noted to the left knee with a joint effusion.  Able to flex her knee approximately 10 degrees.  Diffusely tender.  Unable to do a good stability exam due to discomfort.  Dorsal pedal pulses intact in the left leg.  Normal ankle and foot.  Normal left hip.  Neurological: She is alert.  Skin: Skin is warm and dry.  Psychiatric: She has a normal mood and affect. Her behavior is normal.  Nursing note and vitals reviewed.    ED Treatments / Results  Labs (all labs ordered are listed, but only abnormal results are displayed) Labs Reviewed - No data to display  EKG None  Radiology Ct Knee Left Wo Contrast  Result Date: 08/01/2017 CLINICAL DATA:  Left knee pain after falling down stairs yesterday. EXAM: CT OF THE LEFT KNEE WITHOUT CONTRAST TECHNIQUE: Multidetector CT imaging of the left knee was performed according to the standard protocol. Multiplanar CT image reconstructions were also generated. COMPARISON:  Radiographs dated 08/01/2017 FINDINGS: Bones/Joint/Cartilage There is an oblong ossification in the distal posterior cruciate ligament which I suspect is the result of remote trauma although acute avulsion at the distal PCL insertion could give this appearance. No other visible fractures. Enthesophytes on the upper pole and lower poles of the patella. Dystrophic calcifications in the distal patellar tendon. Moderate joint effusion. Ligaments Suboptimally assessed by CT. Prominence of the proximal aspect of the medial collateral ligament suggesting MCL sprain. LCL appears normal. ACL appears to be intact. Probable old  injury to the distal PCL. Does the patient have ACL insufficiency? Muscles and Tendons Normal. Soft tissues No Baker's cyst. IMPRESSION: 1. Soft tissue prominence of the proximal MCL consistent with MCL sprain. 2. Ossification in the distal posterior cruciate ligament which is most likely due to remote injury. I cannot completely exclude an acute avulsion although I think this is unlikely. 3. Moderate joint effusion. Electronically Signed   By: Lorriane Shire M.D.   On: 08/01/2017 16:38   Dg Knee Complete 4 Views Left  Result Date: 08/01/2017 CLINICAL DATA:  57 y/o  F; fall down steps with severe pain. EXAM: LEFT KNEE - COMPLETE 4+ VIEW COMPARISON:  None. FINDINGS: No evidence of fracture, dislocation, or joint effusion. Small joint effusion. Spurring of tibial spines as well as superior and inferior patellar enthesophytes. IMPRESSION: No acute fracture or dislocation identified.  Small joint effusion. Electronically Signed   By: Mia Creek  [place] M.D.   On: 08/01/2017 13:32    Procedures Procedures (including critical care time)  Medications Ordered in ED Medications  oxyCODONE-acetaminophen (PERCOCET/ROXICET) 5-325 MG per tablet 1 tablet (1 tablet Oral Given 08/01/17 1601)     Initial Impression / Assessment and Plan / ED Course  I have reviewed the triage vital signs and the nursing notes.  Pertinent labs & imaging results that were available during my care of the patient were reviewed by me and considered in my medical decision making (see chart for details).    Patient with left knee injury after falling down the stairs.  Initial x-ray showing joint effusion, otherwise negative.  Patient with pain out of proportion to the findings, unable to move, flex, extend, bear any weight.  Will get CT scan to rule out any occult fractures.  CT scan is concerning for MCL strain.  Patient placed in the immobilizer, patient has her own crutches with her.  Will discharge home with muscle relaxants,  tramadol, NSAIDs, advised to ice, elevate, follow-up with a orthopedic doctor.  Patient admitted that she currently has no insurance, I explained to her that if she is unable to see orthopedist she must follow-up with a family doctor or sports medicine doctor.  I will give her the name for Dr. Barbaraann Barthel.  Return precautions discussed  Vitals:   08/01/17 1307 08/01/17 1512 08/01/17 1725  BP: 109/79 137/73 106/68  Pulse: 92 89 70  Resp: 16 20 18   Temp: 100 F (37.8 C) 99.6 F (37.6 C)   TempSrc: Oral Oral   SpO2: 99% 100% 98%  Weight: 83 kg (183 lb)    Height: 5' 4"  (1.626 m)       Final Clinical Impressions(s) / ED Diagnoses   Final diagnoses:  Sprain of medial collateral ligament of left knee, initial encounter  Fall, initial encounter    ED Discharge Orders        Ordered    cyclobenzaprine (FLEXERIL) 10 MG tablet  2 times daily PRN     08/01/17 1723    traMADol (ULTRAM) 50 MG tablet  Every 6 hours PRN     08/01/17 1723    meloxicam (MOBIC) 15 MG tablet  Daily     08/01/17 1723       Jeannett Senior, PA-C 08/01/17 Knox Saliva, MD 08/02/17 [phone]

## 2017-08-01 NOTE — Discharge Instructions (Addendum)
Ice and elevate your knee. Bear weight as tolerate. Mobic for pain. Tramadol for severe pain. Flexeril for spasms. Follow up with Dr. Pearletha ForgeHudnall.

## 2017-08-01 NOTE — ED Notes (Signed)
Pt verbalizes understanding of d/c instructions and denies any further needs at this time. 

## 2017-08-09 ENCOUNTER — Encounter: Payer: Self-pay | Admitting: Family Medicine

## 2017-08-09 ENCOUNTER — Ambulatory Visit (INDEPENDENT_AMBULATORY_CARE_PROVIDER_SITE_OTHER): Payer: Self-pay | Admitting: Family Medicine

## 2017-08-09 DIAGNOSIS — S8992XA Unspecified injury of left lower leg, initial encounter: Secondary | ICD-10-CM

## 2017-08-09 MED ORDER — HYDROCODONE-ACETAMINOPHEN 5-325 MG PO TABS: 1.0000 | ORAL_TABLET | Freq: Four times a day (QID) | ORAL | 0 refills | Status: DC | PRN

## 2017-08-09 NOTE — Patient Instructions (Signed)
You have an MCL sprain and knee contusion. Ice the area 3-4 times a day for 15 minutes at a time Elevate above level of heart when possible Aleve 2 tabs twice a day with food for pain and inflammation. Norco as needed for severe pain (no driving on this). Wear knee brace when up and walking around Do knee extensions and straight leg raises 3 sets of 10 once a day. Physical therapy is a consideration in the future. Follow up with me in 2 weeks.

## 2017-08-12 ENCOUNTER — Encounter: Payer: Self-pay | Admitting: Family Medicine

## 2017-08-12 DIAGNOSIS — S8992XD Unspecified injury of left lower leg, subsequent encounter: Secondary | ICD-10-CM | POA: Insufficient documentation

## 2017-08-12 NOTE — Progress Notes (Signed)
PCP: Lenard Galloway, NP  Subjective:   HPI: Patient is a 57 y.o. female here for left knee injury.  Patient reports on 4/1 she accidentally fell down 7 steps and hyperflexed her left knee. Immediate pain, swelling. Pain has persisted at 10/10 level, sharp medial and anterior. Using immobilizer. Taking flexeril, tramadol, mobic. Worse with ambulation. No prior injuries.  Past Medical History:  Diagnosis Date  . Diabetes ([place])   . Hypertension   . Neuropathy     Current Outpatient Medications on File Prior to Visit  Medication Sig Dispense Refill  . albuterol (PROVENTIL) (2.5 MG/3ML) 0.083% nebulizer solution Take 2.5 mg by nebulization every 6 (six) hours as needed for wheezing or shortness of breath.    . blood glucose meter kit and supplies KIT Dispense based on patient and insurance preference. Use up to four times daily as directed. (FOR ICD-9 250.00, 250.01). 1 each 0  . budesonide-formoterol (SYMBICORT) 160-4.5 MCG/ACT inhaler Inhale 2 puffs into the lungs 2 (two) times daily.    Marland Kitchen gabapentin (NEURONTIN) 300 MG capsule Take 300 mg by mouth 3 (three) times daily.    . insulin glargine (LANTUS) 100 unit/mL SOPN Inject 0.1 mLs (10 Units total) into the skin at bedtime. 15 mL 0  . lisinopril (PRINIVIL,ZESTRIL) 10 MG tablet Take 10 mg by mouth daily.    Marland Kitchen omega-3 acid ethyl esters (LOVAZA) 1 g capsule Take 1 capsule (1 g total) by mouth 2 (two) times daily. 30 capsule 0   No current facility-administered medications on file prior to visit.     Past Surgical History:  Procedure Laterality Date  . LEEP      No Known Allergies  Social History   Socioeconomic History  . Marital status: Married    Spouse name: Not on file  . Number of children: Not on file  . Years of education: Not on file  . Highest education level: Not on file  Occupational History  . Not on file  Social Needs  . Financial resource strain: Not on file  . Food insecurity:    Worry: Not on  file    Inability: Not on file  . Transportation needs:    Medical: Not on file    Non-medical: Not on file  Tobacco Use  . Smoking status: Current Every Day Smoker  . Smokeless tobacco: Never Used  Substance and Sexual Activity  . Alcohol use: No  . Drug use: No  . Sexual activity: Not on file  Lifestyle  . Physical activity:    Days per week: Not on file    Minutes per session: Not on file  . Stress: Not on file  Relationships  . Social connections:    Talks on phone: Not on file    Gets together: Not on file    Attends religious service: Not on file    Active member of club or organization: Not on file    Attends meetings of clubs or organizations: Not on file    Relationship status: Not on file  . Intimate partner violence:    Fear of current or ex partner: Not on file    Emotionally abused: Not on file    Physically abused: Not on file    Forced sexual activity: Not on file  Other Topics Concern  . Not on file  Social History Narrative  . Not on file    Family History  Problem Relation Age of Onset  . Pancreatic cancer Mother   .  COPD Father     BP (!) 146/87   Pulse (!) 106   Ht _0  (1.626 m)   Wt 183 lb (83 kg)   BMI 31.41 kg/m   Review of Systems: See HPI above.     Objective:  Physical Exam:  Gen: NAD, comfortable in exam room  Left knee: No gross deformity, ecchymoses, effusion. TTP medial joint line, over patella.  No other tenderness. ROM 0 - 100 degrees, 5/5 strength flexion/extension. Negative ant/post drawers. 1+ valgus with pain.  Negative varus. Negative lachmanns. Negative mcmurrays, apleys, patellar apprehension. NV intact distally.  Right knee: No deformity. FROM with 5/5 strength. No tenderness to palpation. NVI distally.   Assessment & Plan:  1. Left knee injury:  Independently reviewed radiographs and no evidence fracture, other bony abnormality.  Exam consistent with Grade 2 MCL sprain and knee contusion.  Icing,  elevation, aleve with norco if needed.  Hinged knee brace for support.  Shown home exercises to do daily.  Consider physical therapy in future.  F/u in 2 weeks.

## 2017-08-12 NOTE — Assessment & Plan Note (Signed)
Independently reviewed radiographs and no evidence fracture, other bony abnormality.  Exam consistent with Grade 2 MCL sprain and knee contusion.  Icing, elevation, aleve with norco if needed.  Hinged knee brace for support.  Shown home exercises to do daily.  Consider physical therapy in future.  F/u in 2 weeks.

## 2017-08-22 ENCOUNTER — Encounter: Payer: Self-pay | Admitting: Family Medicine

## 2017-08-22 ENCOUNTER — Ambulatory Visit (INDEPENDENT_AMBULATORY_CARE_PROVIDER_SITE_OTHER): Payer: Self-pay | Admitting: Family Medicine

## 2017-08-22 DIAGNOSIS — S8992XD Unspecified injury of left lower leg, subsequent encounter: Secondary | ICD-10-CM

## 2017-08-22 NOTE — Assessment & Plan Note (Signed)
Consistent with knee contusion and Grade 2 MCL sprain.  Noted laxity today on ant drawer compared to right now that pain has improved, less guarding.  She will get cone coverage to go ahead with MRI of this knee.  In meantime continue knee brace, icing, aleve, home exercise program, aleve.  F/u in 4 weeks otherwise.

## 2017-08-22 NOTE — Patient Instructions (Signed)
You have an MCL sprain and knee contusion. Ice the area 3-4 times a day for 15 minutes at a time Elevate above level of heart when possible Aleve 2 tabs twice a day with food for pain and inflammation. Wear knee brace when up and walking around Do knee extensions and straight leg raises 3 sets of 10 once a day. Physical therapy is a consideration in the future. Get the cone coverage and get us the letter if/when you are approved - when you do we will go ahead with an MRI of this knee. Follow up with me in 4 weeks otherwise.

## 2017-08-22 NOTE — Progress Notes (Signed)
PCP: Lenard Galloway, NP  Subjective:   HPI: Patient is a 57 y.o. female here for left knee injury.  4/9: Patient reports on 4/1 she accidentally fell down 7 steps and hyperflexed her left knee. Immediate pain, swelling. Pain has persisted at 10/10 level, sharp medial and anterior. Using immobilizer. Taking flexeril, tramadol, mobic. Worse with ambulation. No prior injuries.  4/22: Patient reports her pain has improved compared to last visit. Pain level up to 4/10 at worst with walking. Does feel like knee will give out at times. Taking aleve, doing home exercise program, icing. Wearing knee brace. No skin changes, numbness.  Past Medical History:  Diagnosis Date  . Diabetes ([place])   . Hypertension   . Neuropathy     Current Outpatient Medications on File Prior to Visit  Medication Sig Dispense Refill  . albuterol (PROVENTIL) (2.5 MG/3ML) 0.083% nebulizer solution Take 2.5 mg by nebulization every 6 (six) hours as needed for wheezing or shortness of breath.    . blood glucose meter kit and supplies KIT Dispense based on patient and insurance preference. Use up to four times daily as directed. (FOR ICD-9 250.00, 250.01). 1 each 0  . budesonide-formoterol (SYMBICORT) 160-4.5 MCG/ACT inhaler Inhale 2 puffs into the lungs 2 (two) times daily.    Marland Kitchen gabapentin (NEURONTIN) 300 MG capsule Take 300 mg by mouth 3 (three) times daily.    Marland Kitchen HYDROcodone-acetaminophen (NORCO) 5-325 MG tablet Take 1 tablet by mouth every 6 (six) hours as needed for moderate pain. 20 tablet 0  . insulin glargine (LANTUS) 100 unit/mL SOPN Inject 0.1 mLs (10 Units total) into the skin at bedtime. 15 mL 0  . lisinopril (PRINIVIL,ZESTRIL) 10 MG tablet Take 10 mg by mouth daily.    Marland Kitchen omega-3 acid ethyl esters (LOVAZA) 1 g capsule Take 1 capsule (1 g total) by mouth 2 (two) times daily. 30 capsule 0   No current facility-administered medications on file prior to visit.     Past Surgical History:   Procedure Laterality Date  . LEEP      No Known Allergies  Social History   Socioeconomic History  . Marital status: Married    Spouse name: Not on file  . Number of children: Not on file  . Years of education: Not on file  . Highest education level: Not on file  Occupational History  . Not on file  Social Needs  . Financial resource strain: Not on file  . Food insecurity:    Worry: Not on file    Inability: Not on file  . Transportation needs:    Medical: Not on file    Non-medical: Not on file  Tobacco Use  . Smoking status: Current Every Day Smoker  . Smokeless tobacco: Never Used  Substance and Sexual Activity  . Alcohol use: No  . Drug use: No  . Sexual activity: Not on file  Lifestyle  . Physical activity:    Days per week: Not on file    Minutes per session: Not on file  . Stress: Not on file  Relationships  . Social connections:    Talks on phone: Not on file    Gets together: Not on file    Attends religious service: Not on file    Active member of club or organization: Not on file    Attends meetings of clubs or organizations: Not on file    Relationship status: Not on file  . Intimate partner violence:    Fear of  current or ex partner: Not on file    Emotionally abused: Not on file    Physically abused: Not on file    Forced sexual activity: Not on file  Other Topics Concern  . Not on file  Social History Narrative  . Not on file    Family History  Problem Relation Age of Onset  . Pancreatic cancer Mother   . COPD Father     BP 136/87   Pulse 89   Ht 5' 4"  (1.626 m)   Wt 183 lb (83 kg)   BMI 31.41 kg/m   Review of Systems: See HPI above.     Objective:  Physical Exam:  Gen: NAD, comfortable in exam room  Left knee: No gross deformity, ecchymoses, effusion. TTP medial joint line only.  No other tenderness. ROM 0 - 120 degrees, 5/5 strength flexion and extension without pain. 1+ ant drawer, negative post drawer.  1+ valgus  with pain.  Negative varus.  Negative lachmanns. Negative mcmurrays, apleys, patellar apprehension. NV intact distally.   Assessment & Plan:  1. Left knee injury:  Consistent with knee contusion and Grade 2 MCL sprain.  Noted laxity today on ant drawer compared to right now that pain has improved, less guarding.  She will get cone coverage to go ahead with MRI of this knee.  In meantime continue knee brace, icing, aleve, home exercise program, aleve.  F/u in 4 weeks otherwise.

## 2017-09-19 ENCOUNTER — Ambulatory Visit: Payer: Self-pay | Admitting: Family Medicine

## 2017-09-22 ENCOUNTER — Ambulatory Visit: Payer: Self-pay | Admitting: Family Medicine

## 2017-10-06 ENCOUNTER — Emergency Department (HOSPITAL_BASED_OUTPATIENT_CLINIC_OR_DEPARTMENT_OTHER)
Admission: EM | Admit: 2017-10-06 | Discharge: 2017-10-06 | Disposition: A | Discharge status: Home / Self Care | Payer: Self-pay | Attending: Emergency Medicine | Admitting: Emergency Medicine

## 2017-10-06 ENCOUNTER — Other Ambulatory Visit: Payer: Self-pay

## 2017-10-06 ENCOUNTER — Encounter (HOSPITAL_BASED_OUTPATIENT_CLINIC_OR_DEPARTMENT_OTHER): Payer: Self-pay | Admitting: *Deleted

## 2017-10-06 DIAGNOSIS — E1165 Type 2 diabetes mellitus with hyperglycemia: Secondary | ICD-10-CM | POA: Insufficient documentation

## 2017-10-06 DIAGNOSIS — R739 Hyperglycemia, unspecified: Secondary | ICD-10-CM

## 2017-10-06 DIAGNOSIS — I10 Essential (primary) hypertension: Secondary | ICD-10-CM | POA: Insufficient documentation

## 2017-10-06 DIAGNOSIS — Z79899 Other long term (current) drug therapy: Secondary | ICD-10-CM | POA: Insufficient documentation

## 2017-10-06 DIAGNOSIS — F17200 Nicotine dependence, unspecified, uncomplicated: Secondary | ICD-10-CM | POA: Insufficient documentation

## 2017-10-06 DIAGNOSIS — Z794 Long term (current) use of insulin: Secondary | ICD-10-CM | POA: Insufficient documentation

## 2017-10-06 DIAGNOSIS — R112 Nausea with vomiting, unspecified: Secondary | ICD-10-CM | POA: Insufficient documentation

## 2017-10-06 DIAGNOSIS — R197 Diarrhea, unspecified: Secondary | ICD-10-CM | POA: Insufficient documentation

## 2017-10-06 LAB — CBC WITH DIFFERENTIAL/PLATELET
BASOS ABS: 0 10*3/uL (ref 0.0–0.1)
Basophils Relative: 0 %
EOS ABS: 0.1 10*3/uL (ref 0.0–0.7)
Eosinophils Relative: 1 %
HCT: 40.4 % (ref 36.0–46.0)
HEMOGLOBIN: 14.5 g/dL (ref 12.0–15.0)
Lymphocytes Relative: 48 %
Lymphs Abs: 4.6 10*3/uL — ABNORMAL HIGH (ref 0.7–4.0)
MCH: 30.9 pg (ref 26.0–34.0)
MCHC: 35.9 g/dL (ref 30.0–36.0)
MCV: 86.1 fL (ref 78.0–100.0)
MONO ABS: 0.3 10*3/uL (ref 0.1–1.0)
Monocytes Relative: 3 %
NEUTROS ABS: 4.5 10*3/uL (ref 1.7–7.7)
NEUTROS PCT: 48 %
PLATELETS: 214 10*3/uL (ref 150–400)
RBC: 4.69 MIL/uL (ref 3.87–5.11)
RDW: 12.1 % (ref 11.5–15.5)
WBC: 9.5 10*3/uL (ref 4.0–10.5)

## 2017-10-06 LAB — COMPREHENSIVE METABOLIC PANEL
ALT: 20 U/L (ref 14–54)
ANION GAP: 11 (ref 5–15)
AST: 18 U/L (ref 15–41)
Albumin: 4.4 g/dL (ref 3.5–5.0)
Alkaline Phosphatase: 112 U/L (ref 38–126)
BILIRUBIN TOTAL: 1.1 mg/dL (ref 0.3–1.2)
BUN: 20 mg/dL (ref 6–20)
CALCIUM: 9.8 mg/dL (ref 8.9–10.3)
CO2: 27 mmol/L (ref 22–32)
Chloride: 95 mmol/L — ABNORMAL LOW (ref 101–111)
Creatinine, Ser: 1.17 mg/dL — ABNORMAL HIGH (ref 0.44–1.00)
GFR calc Af Amer: 59 mL/min — ABNORMAL LOW (ref >60)
GFR, EST NON AFRICAN AMERICAN: 51 mL/min — AB (ref >60)
Glucose, Bld: 457 mg/dL — ABNORMAL HIGH (ref 65–99)
POTASSIUM: 3.3 mmol/L — AB (ref 3.5–5.1)
Sodium: 133 mmol/L — ABNORMAL LOW (ref 135–145)
TOTAL PROTEIN: 7.9 g/dL (ref 6.5–8.1)

## 2017-10-06 LAB — URINALYSIS, MICROSCOPIC (REFLEX)

## 2017-10-06 LAB — URINALYSIS, ROUTINE W REFLEX MICROSCOPIC
BILIRUBIN URINE: NEGATIVE
KETONES UR: 15 mg/dL — AB
Nitrite: NEGATIVE
PH: 6 (ref 5.0–8.0)
Protein, ur: NEGATIVE mg/dL
Specific Gravity, Urine: 1.015 (ref 1.005–1.030)

## 2017-10-06 LAB — I-STAT CG4 LACTIC ACID, ED: Lactic Acid, Venous: 1.93 mmol/L — ABNORMAL HIGH (ref 0.5–1.9)

## 2017-10-06 LAB — LIPASE, BLOOD: Lipase: 33 U/L (ref 11–51)

## 2017-10-06 LAB — CBG MONITORING, ED: Glucose-Capillary: 343 mg/dL — ABNORMAL HIGH (ref 65–99)

## 2017-10-06 MED ORDER — SODIUM CHLORIDE 0.9 % IV BOLUS
500.0000 mL | Freq: Once | INTRAVENOUS | Status: AC
  Administered 2017-10-06: 500 mL via INTRAVENOUS

## 2017-10-06 MED ORDER — ONDANSETRON 4 MG PO TBDP: 4.0000 mg | ORAL_TABLET | Freq: Three times a day (TID) | ORAL | 0 refills | Status: DC | PRN

## 2017-10-06 MED ORDER — INSULIN ASPART 100 UNIT/ML ~~LOC~~ SOLN
5.0000 [IU] | Freq: Once | SUBCUTANEOUS | Status: AC
  Administered 2017-10-06: 5 [IU] via SUBCUTANEOUS
  Filled 2017-10-06: qty 1

## 2017-10-06 MED ORDER — SODIUM CHLORIDE 0.9 % IV BOLUS
1000.0000 mL | Freq: Once | INTRAVENOUS | Status: AC
Start: 2017-10-06 — End: 2017-10-06
  Administered 2017-10-06: 1000 mL via INTRAVENOUS

## 2017-10-06 MED ORDER — ONDANSETRON HCL 4 MG/2ML IJ SOLN
4.0000 mg | Freq: Once | INTRAMUSCULAR | Status: AC
  Administered 2017-10-06: 4 mg via INTRAVENOUS
  Filled 2017-10-06: qty 2

## 2017-10-06 NOTE — ED Triage Notes (Addendum)
Pt c/o n/v/d  x 3 days and increased blood sugar

## 2017-10-06 NOTE — ED Provider Notes (Signed)
[facility] EMERGENCY DEPARTMENT Provider Note   CSN: [id number] Arrival date & time: 10/06/17  1706     History   Chief Complaint Chief Complaint  Patient presents with  . Hyperglycemia    HPI Michelle Hess is a 57 y.o. female.  57yo F w/ PMH including IDDM, HTN who p/w hyperglycemia, vomiting, and diarrhea. Pt reports 3 days of non-bloody diarrhea associated w/ nausea and vomiting.  She is concerned that her blood sugar is elevated but she does not have testing strips at home in order to test it.  She endorses polyuria and polydipsia and daughter states that in the past she had vomiting and was found to be hyperglycemic.  She reports that she is compliant with her insulin, had her dose yesterday evening.  She denies any sick contacts, recent travel, or recent antibiotic use.  No abdominal pain.  No fevers.  No dysuria.  The history is provided by the patient.  Hyperglycemia    Past Medical History:  Diagnosis Date  . Diabetes ([place])   . Hypertension   . Neuropathy     Patient Active Problem List   Diagnosis Date Noted  . Left knee injury, subsequent encounter 08/12/2017  . Diabetes mellitus, new onset ([place])   . Upper respiratory tract infection     Past Surgical History:  Procedure Laterality Date  . LEEP       OB History   None      Home Medications    Prior to Admission medications   Medication Sig Start Date End Date Taking? Authorizing Provider  atorvastatin (LIPITOR) 10 MG tablet Take 10 mg by mouth daily.   Yes [provider]  albuterol (PROVENTIL) (2.5 MG/3ML) 0.083% nebulizer solution Take 2.5 mg by nebulization every 6 (six) hours as needed for wheezing or shortness of breath.    [provider]  blood glucose meter kit and supplies KIT Dispense based on patient and insurance preference. Use up to four times daily as directed. (FOR ICD-9 250.00, 250.01). 02/14/17   Robbie Lis, MD  budesonide-formoterol [facility])  160-4.5 MCG/ACT inhaler Inhale 2 puffs into the lungs 2 (two) times daily.    [provider]  gabapentin (NEURONTIN) 300 MG capsule Take 300 mg by mouth 3 (three) times daily.    [provider]  HYDROcodone-acetaminophen (NORCO) 5-325 MG tablet Take 1 tablet by mouth every 6 (six) hours as needed for moderate pain. 08/09/17   Hudnall, Sharyn Lull, MD  insulin glargine (LANTUS) 100 unit/mL SOPN Inject 0.1 mLs (10 Units total) into the skin at bedtime. 02/14/17   Robbie Lis, MD  lisinopril (PRINIVIL,ZESTRIL) 10 MG tablet Take 10 mg by mouth daily.    [provider]  omega-3 acid ethyl esters (LOVAZA) 1 g capsule Take 1 capsule (1 g total) by mouth 2 (two) times daily. 02/14/17   Robbie Lis, MD  ondansetron (ZOFRAN ODT) 4 MG disintegrating tablet Take 1 tablet (4 mg total) by mouth every 8 (eight) hours as needed for nausea or vomiting. 10/06/17   Little, Wenda Overland, MD    Family History Family History  Problem Relation Age of Onset  . Pancreatic cancer Mother   . COPD Father     Social History Social History   Tobacco Use  . Smoking status: Current Every Day Smoker  . Smokeless tobacco: Never Used  Substance Use Topics  . Alcohol use: No  . Drug use: No     Allergies  Patient has no known allergies.   Review of Systems Review of Systems All other systems reviewed and are negative except that which was mentioned in HPI   Physical Exam Updated Vital Signs BP 112/83 (BP Location: Left Arm)   Pulse 79   Temp 98.6 F (37 C) (Oral)   Resp 16   Ht 5' 4"  (1.626 m)   Wt 86.2 kg (190 lb)   SpO2 100%   BMI 32.61 kg/m   Physical Exam  Constitutional: She is oriented to person, place, and time. She appears well-developed and well-nourished. No distress.  uncomfortable  HENT:  Head: Normocephalic and atraumatic.  Mildly dry mucous membranes  Eyes: Conjunctivae are normal.  Neck: Neck supple.  Cardiovascular: Normal rate, regular rhythm and  normal heart sounds.  No murmur heard. Pulmonary/Chest: Effort normal and breath sounds normal.  Abdominal: Soft. Bowel sounds are normal. She exhibits no distension. There is no tenderness.  Musculoskeletal: She exhibits no edema.  Neurological: She is alert and oriented to person, place, and time.  Fluent speech  Skin: Skin is warm and dry.  Psychiatric: She has a normal mood and affect. Judgment normal.  Nursing note and vitals reviewed.    ED Treatments / Results  Labs (all labs ordered are listed, but only abnormal results are displayed) Labs Reviewed  URINALYSIS, ROUTINE W REFLEX MICROSCOPIC - Abnormal; Notable for the following components:      Result Value   Glucose, UA >=500 (*)    Hgb urine dipstick SMALL (*)    Ketones, ur 15 (*)    Leukocytes, UA TRACE (*)    All other components within normal limits  COMPREHENSIVE METABOLIC PANEL - Abnormal; Notable for the following components:   Sodium 133 (*)    Potassium 3.3 (*)    Chloride 95 (*)    Glucose, Bld 457 (*)    Creatinine, Ser 1.17 (*)    GFR calc non Af Amer 51 (*)    GFR calc Af Amer 59 (*)    All other components within normal limits  CBC WITH DIFFERENTIAL/PLATELET - Abnormal; Notable for the following components:   Lymphs Abs 4.6 (*)    All other components within normal limits  URINALYSIS, MICROSCOPIC (REFLEX) - Abnormal; Notable for the following components:   Bacteria, UA MANY (*)    All other components within normal limits  CBG MONITORING, ED - Abnormal; Notable for the following components:   Glucose-Capillary 343 (*)    All other components within normal limits  I-STAT CG4 LACTIC ACID, ED - Abnormal; Notable for the following components:   Lactic Acid, Venous 1.93 (*)    All other components within normal limits  LIPASE, BLOOD  CBG MONITORING, ED    EKG None  Radiology No results found.  Procedures Procedures (including critical care time)  Medications Ordered in ED Medications    ondansetron (ZOFRAN) injection 4 mg (4 mg Intravenous Given 10/06/17 1736)  sodium chloride 0.9 % bolus 1,000 mL (0 mLs Intravenous Stopped 10/06/17 1853)  sodium chloride 0.9 % bolus 500 mL (0 mLs Intravenous Stopped 10/06/17 2008)  insulin aspart (novoLOG) injection 5 Units (5 Units Subcutaneous Given 10/06/17 1851)     Initial Impression / Assessment and Plan / ED Course  I have reviewed the triage vital signs and the nursing notes.  Pertinent labs & imaging results that were available during my care of the patient were reviewed by me and considered in my medical decision making (see chart for details).  PT non-toxic on exam w/ reassuring VS. no focal abdominal tenderness.  Gave Zofran and IV fluid bolus.  Show lactate 1.93, normal CBC, glucose 457, creatinine 1.17, potassium 3.3, normal anion gap, UA without convincing evidence of infection.  Gave NovoLog.  Repeat blood glucose improved to 343.  Patient's nausea improved on repeat assessment and she wanted to go home.  Discussed supportive measures for diarrhea and provided with Zofran to use as needed.  Emphasized the importance of compliance with diabetes medications.  Return precautions extensively reviewed.  Final Clinical Impressions(s) / ED Diagnoses   Final diagnoses:  Hyperglycemia  Nausea vomiting and diarrhea    ED Discharge Orders        Ordered    ondansetron (ZOFRAN ODT) 4 MG disintegrating tablet  Every 8 hours PRN     10/06/17 2049       Little, Wenda Overland, MD 10/07/17 [phone]

## 2017-10-06 NOTE — ED Notes (Signed)
Pt attempting urine sample 

## 2017-10-07 LAB — CBG MONITORING, ED: Glucose-Capillary: 471 mg/dL — ABNORMAL HIGH (ref 65–99)

## 2017-10-24 ENCOUNTER — Telehealth: Payer: Self-pay | Admitting: *Deleted

## 2017-10-24 NOTE — Telephone Encounter (Signed)
Received request for Medical records from [place] [employer], forwarded to [place] for email/scan/SLS 06/24   

## 2017-11-07 DIAGNOSIS — E1142 Type 2 diabetes mellitus with diabetic polyneuropathy: Secondary | ICD-10-CM | POA: Insufficient documentation

## 2017-11-07 DIAGNOSIS — F5101 Primary insomnia: Secondary | ICD-10-CM | POA: Insufficient documentation

## 2017-11-07 DIAGNOSIS — I10 Essential (primary) hypertension: Secondary | ICD-10-CM | POA: Insufficient documentation

## 2017-11-07 DIAGNOSIS — F418 Other specified anxiety disorders: Secondary | ICD-10-CM | POA: Insufficient documentation

## 2017-11-07 DIAGNOSIS — IMO0002 Reserved for concepts with insufficient information to code with codable children: Secondary | ICD-10-CM | POA: Insufficient documentation

## 2017-11-07 DIAGNOSIS — E782 Mixed hyperlipidemia: Secondary | ICD-10-CM | POA: Insufficient documentation

## 2017-11-07 DIAGNOSIS — F17200 Nicotine dependence, unspecified, uncomplicated: Secondary | ICD-10-CM | POA: Insufficient documentation

## 2017-11-07 DIAGNOSIS — E559 Vitamin D deficiency, unspecified: Secondary | ICD-10-CM | POA: Insufficient documentation

## 2017-11-07 DIAGNOSIS — E1165 Type 2 diabetes mellitus with hyperglycemia: Secondary | ICD-10-CM

## 2017-11-15 ENCOUNTER — Encounter: Payer: Self-pay | Admitting: Family Medicine

## 2017-11-15 ENCOUNTER — Ambulatory Visit (INDEPENDENT_AMBULATORY_CARE_PROVIDER_SITE_OTHER): Payer: Self-pay | Admitting: Family Medicine

## 2017-11-15 DIAGNOSIS — S8992XD Unspecified injury of left lower leg, subsequent encounter: Secondary | ICD-10-CM

## 2017-11-15 MED ORDER — OXYCODONE-ACETAMINOPHEN 5-325 MG PO TABS: 1.0000 | ORAL_TABLET | ORAL | 0 refills | Status: DC | PRN

## 2017-11-15 NOTE — Assessment & Plan Note (Signed)
Concerning for MCL and ACL tears though MCL has healed some compared to her last visit.  She has appointment to get [facility] Coverage in 2 weeks - when this goes through will go ahead with MRI.  Icing, ibuprofen, gabapentin, biofreeze, salon pas.  Rx percocet as needed.  Knee brace for support in meantime.  Shown home exercises to do daily.

## 2017-11-15 NOTE — Patient Instructions (Signed)
Get the cone coverage asap We will go ahead with an MRI as soon as this goes through - I'm concerned you've torn your ACL. Ice the area 3-4 times a day for 15 minutes at a time Elevate above level of heart when possible Ibuprofen 800mg  three times a day with food. Continue your gabapentin. Continue biofreeze. Consider salon pas patches as well. Percocet as needed for severe pain - no driving on this. Wear knee brace when up and walking around Do knee extensions and straight leg raises 3 sets of 10 once a day.

## 2017-11-15 NOTE — Progress Notes (Signed)
PCP: Vista DeckIheanacho, Celestina, NP  Subjective:   HPI: Patient is a 57 y.o. female here for left knee injury.  4/9: Patient reports on 4/1 she accidentally fell down 7 steps and hyperflexed her left knee. Immediate pain, swelling. Pain has persisted at 10/10 level, sharp medial and anterior. Using immobilizer. Taking flexeril, tramadol, mobic. Worse with ambulation. No prior injuries.  4/22: Patient reports her pain has improved compared to last visit. Pain level up to 4/10 at worst with walking. Does feel like knee will give out at times. Taking aleve, doing home exercise program, icing. Wearing knee brace. No skin changes, numbness.  7/16: Patient returns complaining of 10/10 pain in her left knee. Pain is sharp, shoots down into ankle. She is unable to get comfortable and this wakes her up at night. She has tried tylenol arthritis and motrin 800mg , knee brace, icing, biofreeze. Reports she had a fall on May 16 in addition to her initial injury. No skin changes.  Past Medical History:  Diagnosis Date  . Diabetes ([facility])   . Hypertension   . Neuropathy     Current Outpatient Medications on File Prior to Visit  Medication Sig Dispense Refill  . Insulin Glargine (LANTUS SOLOSTAR) 100 UNIT/ML Solostar Pen Inject into the skin.    . metFORMIN (GLUCOPHAGE-XR) 500 MG 24 hr tablet Take by mouth.    Marland Kitchen. atorvastatin (LIPITOR) 80 MG tablet Take 80 mg by mouth at bedtime.  1  . budesonide-formoterol (SYMBICORT) 160-4.5 MCG/ACT inhaler Inhale 2 puffs into the lungs 2 (two) times daily.    . citalopram (CELEXA) 20 MG tablet Take 20 mg by mouth daily.  0  . gabapentin (NEURONTIN) 300 MG capsule Take 300 mg by mouth 3 (three) times daily.    Marland Kitchen. lisinopril-hydrochlorothiazide (PRINZIDE,ZESTORETIC) 20-25 MG tablet Take 1 tablet by mouth daily.  1  . omega-3 acid ethyl esters (LOVAZA) 1 g capsule Take 1 capsule (1 g total) by mouth 2 (two) times daily. 30 capsule 0   No current  facility-administered medications on file prior to visit.     Past Surgical History:  Procedure Laterality Date  . LEEP      No Known Allergies  Social History   Socioeconomic History  . Marital status: Married    Spouse name: Not on file  . Number of children: Not on file  . Years of education: Not on file  . Highest education level: Not on file  Occupational History  . Not on file  Social Needs  . Financial resource strain: Not on file  . Food insecurity:    Worry: Not on file    Inability: Not on file  . Transportation needs:    Medical: Not on file    Non-medical: Not on file  Tobacco Use  . Smoking status: Current Every Day Smoker  . Smokeless tobacco: Never Used  Substance and Sexual Activity  . Alcohol use: No  . Drug use: No  . Sexual activity: Not on file  Lifestyle  . Physical activity:    Days per week: Not on file    Minutes per session: Not on file  . Stress: Not on file  Relationships  . Social connections:    Talks on phone: Not on file    Gets together: Not on file    Attends religious service: Not on file    Active member of club or organization: Not on file    Attends meetings of clubs or organizations: Not on file  Relationship status: Not on file  . Intimate partner violence:    Fear of current or ex partner: Not on file    Emotionally abused: Not on file    Physically abused: Not on file    Forced sexual activity: Not on file  Other Topics Concern  . Not on file  Social History Narrative  . Not on file    Family History  Problem Relation Age of Onset  . Pancreatic cancer Mother   . COPD Father     BP (!) 145/89   Pulse (!) 116   Ht 5\' 4"  (1.626 m)   Wt 174 lb (78.9 kg)   BMI 29.87 kg/m   Review of Systems: See HPI above.     Objective:  Physical Exam:  Gen: NAD, comfortable in exam room  Left knee: No gross deformity, ecchymoses, swelling. TTP mildly medial joint line, post patellar facets. FROM with 5/5  strength. Positive ant drawer, negative post drawer.  Pain on valgus with trace laxity.  Negative varus.  Positive lachmanns. Negative mcmurrays, apleys, patellar apprehension. NV intact distally.   Assessment & Plan:  1. Left knee injury:  Concerning for MCL and ACL tears though MCL has healed some compared to her last visit.  She has appointment to get [facility] Coverage in 2 weeks - when this goes through will go ahead with MRI.  Icing, ibuprofen, gabapentin, biofreeze, salon pas.  Rx percocet as needed.  Knee brace for support in meantime.  Shown home exercises to do daily.

## 2017-11-29 ENCOUNTER — Ambulatory Visit: Payer: Self-pay

## 2018-02-06 ENCOUNTER — Other Ambulatory Visit: Payer: Self-pay

## 2018-02-06 ENCOUNTER — Emergency Department (HOSPITAL_BASED_OUTPATIENT_CLINIC_OR_DEPARTMENT_OTHER)
Admission: EM | Admit: 2018-02-06 | Discharge: 2018-02-06 | Disposition: A | Discharge status: Home / Self Care | Payer: Self-pay | Attending: Emergency Medicine | Admitting: Emergency Medicine

## 2018-02-06 DIAGNOSIS — Z79899 Other long term (current) drug therapy: Secondary | ICD-10-CM | POA: Insufficient documentation

## 2018-02-06 DIAGNOSIS — I10 Essential (primary) hypertension: Secondary | ICD-10-CM | POA: Insufficient documentation

## 2018-02-06 DIAGNOSIS — Y929 Unspecified place or not applicable: Secondary | ICD-10-CM | POA: Insufficient documentation

## 2018-02-06 DIAGNOSIS — Y33XXXA Other specified events, undetermined intent, initial encounter: Secondary | ICD-10-CM | POA: Insufficient documentation

## 2018-02-06 DIAGNOSIS — Y999 Unspecified external cause status: Secondary | ICD-10-CM | POA: Insufficient documentation

## 2018-02-06 DIAGNOSIS — F17200 Nicotine dependence, unspecified, uncomplicated: Secondary | ICD-10-CM | POA: Insufficient documentation

## 2018-02-06 DIAGNOSIS — S0501XA Injury of conjunctiva and corneal abrasion without foreign body, right eye, initial encounter: Secondary | ICD-10-CM | POA: Insufficient documentation

## 2018-02-06 DIAGNOSIS — E1140 Type 2 diabetes mellitus with diabetic neuropathy, unspecified: Secondary | ICD-10-CM | POA: Insufficient documentation

## 2018-02-06 DIAGNOSIS — Z794 Long term (current) use of insulin: Secondary | ICD-10-CM | POA: Insufficient documentation

## 2018-02-06 DIAGNOSIS — Y9389 Activity, other specified: Secondary | ICD-10-CM | POA: Insufficient documentation

## 2018-02-06 DIAGNOSIS — Z23 Encounter for immunization: Secondary | ICD-10-CM | POA: Insufficient documentation

## 2018-02-06 MED ORDER — NAPROXEN 500 MG PO TABS: 500.0000 mg | ORAL_TABLET | Freq: Two times a day (BID) | ORAL | 0 refills | Status: DC

## 2018-02-06 MED ORDER — TETANUS-DIPHTH-ACELL PERTUSSIS 5-2.5-18.5 LF-MCG/0.5 IM SUSP
0.5000 mL | Freq: Once | INTRAMUSCULAR | Status: AC
  Administered 2018-02-06: 0.5 mL via INTRAMUSCULAR
  Filled 2018-02-06: qty 0.5

## 2018-02-06 MED ORDER — TETRACAINE HCL 0.5 % OP SOLN
1.0000 [drp] | Freq: Once | OPHTHALMIC | Status: AC
  Administered 2018-02-06: 1 [drp] via OPHTHALMIC
  Filled 2018-02-06: qty 4

## 2018-02-06 MED ORDER — FLUORESCEIN SODIUM 1 MG OP STRP
1.0000 | ORAL_STRIP | Freq: Once | OPHTHALMIC | Status: AC
  Administered 2018-02-06: 1 via OPHTHALMIC
  Filled 2018-02-06: qty 1

## 2018-02-06 MED ORDER — ERYTHROMYCIN 5 MG/GM OP OINT: TOPICAL_OINTMENT | OPHTHALMIC | 0 refills | Status: DC

## 2018-02-06 NOTE — ED Provider Notes (Signed)
[facility] EMERGENCY DEPARTMENT Provider Note   CSN: [id number] Arrival date & time: 02/06/18  0406     History   Chief Complaint Chief Complaint  Patient presents with  . Eye Pain    HPI De Libman is a 57 y.o. female.  HPI  This is a 57 year old female with history of hypertension and diabetes who presents with foreign body sensation in the right eye.  Patient reports that she initially felt this 1 to 2 days ago.  She felt like something scratched her eye.  She got up this morning and felt like her eye was hurting more.  She had a return of foreign body sensation.  She denies any vision changes.  Unknown last tetanus shot.  She does not wear contacts or corrective lenses.  She has not taken anything for her pain.  She rates her pain at 6 out of 10.  Past Medical History:  Diagnosis Date  . Diabetes (HCC)   . Hypertension   . Neuropathy     Patient Active Problem List   Diagnosis Date Noted  . Depression with anxiety 11/07/2017  . Essential hypertension 11/07/2017  . Mixed hyperlipidemia 11/07/2017  . Primary insomnia 11/07/2017  . Tobacco dependence 11/07/2017  . Uncontrolled type 2 diabetes mellitus with peripheral neuropathy (HCC) 11/07/2017  . Vitamin D deficiency 11/07/2017  . Left knee injury, subsequent encounter 08/12/2017  . Upper respiratory tract infection     Past Surgical History:  Procedure Laterality Date  . LEEP       OB History   None      Home Medications    Prior to Admission medications   Medication Sig Start Date End Date Taking? Authorizing Provider  atorvastatin (LIPITOR) 80 MG tablet Take 80 mg by mouth at bedtime. 11/11/17   [provider]  budesonide-formoterol (SYMBICORT) 160-4.5 MCG/ACT inhaler Inhale 2 puffs into the lungs 2 (two) times daily.    [provider]  citalopram (CELEXA) 20 MG tablet Take 20 mg by mouth daily. 11/11/17   [provider]  erythromycin ophthalmic ointment  Place a 1/2 inch ribbon of ointment into the lower eyelid. 02/06/18   Horton, Mayer Masker, MD  gabapentin (NEURONTIN) 300 MG capsule Take 300 mg by mouth 3 (three) times daily.    [provider]  Insulin Glargine (LANTUS SOLOSTAR) 100 UNIT/ML Solostar Pen Inject into the skin. 11/11/17   [provider]  lisinopril-hydrochlorothiazide (PRINZIDE,ZESTORETIC) 20-25 MG tablet Take 1 tablet by mouth daily. 10/29/17   [provider]  metFORMIN (GLUCOPHAGE-XR) 500 MG 24 hr tablet Take by mouth. 11/11/17   [provider]  naproxen (NAPROSYN) 500 MG tablet Take 1 tablet (500 mg total) by mouth 2 (two) times daily. 02/06/18   Horton, Mayer Masker, MD  omega-3 acid ethyl esters (LOVAZA) 1 g capsule Take 1 capsule (1 g total) by mouth 2 (two) times daily. 02/14/17   Alison Murray, MD  oxyCODONE-acetaminophen (PERCOCET/ROXICET) 5-325 MG tablet Take 1 tablet by mouth every 4 (four) hours as needed for severe pain. 11/15/17   Lenda Kelp, MD    Family History Family History  Problem Relation Age of Onset  . Pancreatic cancer Mother   . COPD Father     Social History Social History   Tobacco Use  . Smoking status: Current Every Day Smoker  . Smokeless tobacco: Never Used  Substance Use Topics  . Alcohol use: No  . Drug use: No     Allergies  Patient has no known allergies.   Review of Systems Review of Systems  Eyes: Positive for pain and redness. Negative for visual disturbance.  Neurological: Negative for headaches.  All other systems reviewed and are negative.    Physical Exam Updated Vital Signs BP 123/80   Pulse 61   Temp 98 F (36.7 C) (Oral)   Ht 1.626 m (5\' 4" )   Wt 77.1 kg   SpO2 98%   BMI 29.18 kg/m   Physical Exam  Constitutional: She is oriented to person, place, and time. She appears well-developed and well-nourished.  Overweight  HENT:  Head: Normocephalic and atraumatic.  Eyes: Pupils are equal, round, and reactive to  light. EOM and lids are normal. Lids are everted and swept, no foreign bodies found. Right eye exhibits no discharge. No foreign body present in the right eye. Left eye exhibits no discharge. No foreign body present in the left eye. Right conjunctiva is injected. Left conjunctiva is not injected.    Neck: Neck supple.  Cardiovascular: Normal rate, regular rhythm and normal heart sounds.  Pulmonary/Chest: Effort normal. No respiratory distress. She has no wheezes.  Abdominal: Soft. Bowel sounds are normal.  Neurological: She is alert and oriented to person, place, and time.  Skin: Skin is warm and dry.  Psychiatric: She has a normal mood and affect.  Nursing note and vitals reviewed.    ED Treatments / Results  Labs (all labs ordered are listed, but only abnormal results are displayed) Labs Reviewed - No data to display  EKG None  Radiology No results found.  Procedures Procedures (including critical care time)  Medications Ordered in ED Medications  Tdap (BOOSTRIX) injection 0.5 mL (has no administration in time range)  tetracaine (PONTOCAINE) 0.5 % ophthalmic solution 1 drop (1 drop Both Eyes Given by Other 02/06/18 0431)  fluorescein ophthalmic strip 1 strip (1 strip Both Eyes Given by Other 02/06/18 0431)     Initial Impression / Assessment and Plan / ED Course  I have reviewed the triage vital signs and the nursing notes.  Pertinent labs & imaging results that were available during my care of the patient were reviewed by me and considered in my medical decision making (see chart for details).     Patient presents with foreign body sensation in the right eye.  No vision changes.  She is overall nontoxic-appearing vital signs are reassuring.  Fluoroscein exam with corneal abrasion noted over the pupil.  No foreign bodies.  Patient's tetanus was updated.  Patient will be discharged with erythromycin ophthalmology follow-up.  Final Clinical Impressions(s) / ED Diagnoses    Final diagnoses:  Abrasion of right cornea, initial encounter    ED Discharge Orders         Ordered    erythromycin ophthalmic ointment     02/06/18 0440    naproxen (NAPROSYN) 500 MG tablet  2 times daily     02/06/18 0440           Horton, Mayer Masker, MD 02/06/18 [id number]

## 2018-02-06 NOTE — ED Triage Notes (Signed)
Pt felt like something scratched her eye a few days ago. This morning she got up to go to the bathroom and noticed that her eye was hurting. Feels like something is scratching it every time I blink

## 2018-02-06 NOTE — ED Notes (Signed)
MD with pt  

## 2018-03-09 ENCOUNTER — Other Ambulatory Visit: Payer: Self-pay

## 2018-03-09 ENCOUNTER — Encounter (HOSPITAL_BASED_OUTPATIENT_CLINIC_OR_DEPARTMENT_OTHER): Payer: Self-pay

## 2018-03-09 ENCOUNTER — Emergency Department (HOSPITAL_BASED_OUTPATIENT_CLINIC_OR_DEPARTMENT_OTHER)
Admission: EM | Admit: 2018-03-09 | Discharge: 2018-03-09 | Disposition: A | Discharge status: Home / Self Care | Payer: Self-pay | Attending: Emergency Medicine | Admitting: Emergency Medicine

## 2018-03-09 DIAGNOSIS — B349 Viral infection, unspecified: Secondary | ICD-10-CM

## 2018-03-09 DIAGNOSIS — E1140 Type 2 diabetes mellitus with diabetic neuropathy, unspecified: Secondary | ICD-10-CM | POA: Insufficient documentation

## 2018-03-09 DIAGNOSIS — R52 Pain, unspecified: Secondary | ICD-10-CM | POA: Insufficient documentation

## 2018-03-09 DIAGNOSIS — Z79899 Other long term (current) drug therapy: Secondary | ICD-10-CM | POA: Insufficient documentation

## 2018-03-09 DIAGNOSIS — Z794 Long term (current) use of insulin: Secondary | ICD-10-CM | POA: Insufficient documentation

## 2018-03-09 DIAGNOSIS — I10 Essential (primary) hypertension: Secondary | ICD-10-CM | POA: Insufficient documentation

## 2018-03-09 DIAGNOSIS — F17210 Nicotine dependence, cigarettes, uncomplicated: Secondary | ICD-10-CM | POA: Insufficient documentation

## 2018-03-09 LAB — COMPREHENSIVE METABOLIC PANEL
ALBUMIN: 4.2 g/dL (ref 3.5–5.0)
ALK PHOS: 89 U/L (ref 38–126)
ALT: 20 U/L (ref 0–44)
AST: 17 U/L (ref 15–41)
Anion gap: 12 (ref 5–15)
BILIRUBIN TOTAL: 0.5 mg/dL (ref 0.3–1.2)
BUN: 21 mg/dL — AB (ref 6–20)
CALCIUM: 9.6 mg/dL (ref 8.9–10.3)
CO2: 26 mmol/L (ref 22–32)
CREATININE: 0.98 mg/dL (ref 0.44–1.00)
Chloride: 102 mmol/L (ref 98–111)
GFR calc Af Amer: >60 mL/min (ref >60)
GFR calc non Af Amer: >60 mL/min (ref >60)
GLUCOSE: 95 mg/dL (ref 70–99)
Potassium: 3.1 mmol/L — ABNORMAL LOW (ref 3.5–5.1)
Sodium: 140 mmol/L (ref 135–145)
TOTAL PROTEIN: 7.2 g/dL (ref 6.5–8.1)

## 2018-03-09 LAB — CBC
HEMATOCRIT: 41.5 % (ref 36.0–46.0)
HEMOGLOBIN: 13.3 g/dL (ref 12.0–15.0)
MCH: 30.2 pg (ref 26.0–34.0)
MCHC: 32 g/dL (ref 30.0–36.0)
MCV: 94.3 fL (ref 80.0–100.0)
NRBC: 0 % (ref 0.0–0.2)
Platelets: 261 10*3/uL (ref 150–400)
RBC: 4.4 MIL/uL (ref 3.87–5.11)
RDW: 13.5 % (ref 11.5–15.5)
WBC: 9.8 10*3/uL (ref 4.0–10.5)

## 2018-03-09 LAB — LIPASE, BLOOD: Lipase: 45 U/L (ref 11–51)

## 2018-03-09 MED ORDER — POTASSIUM CHLORIDE CRYS ER 20 MEQ PO TBCR: 20.0000 meq | EXTENDED_RELEASE_TABLET | Freq: Two times a day (BID) | ORAL | 0 refills | Status: DC

## 2018-03-09 MED ORDER — ONDANSETRON HCL 4 MG PO TABS: 4.0000 mg | ORAL_TABLET | Freq: Four times a day (QID) | ORAL | 0 refills | Status: DC

## 2018-03-09 MED ORDER — LOPERAMIDE HCL 2 MG PO CAPS: 2.0000 mg | ORAL_CAPSULE | Freq: Four times a day (QID) | ORAL | 0 refills | Status: DC | PRN

## 2018-03-09 MED ORDER — ONDANSETRON 4 MG PO TBDP
4.0000 mg | ORAL_TABLET | Freq: Once | ORAL | Status: AC
  Administered 2018-03-09: 4 mg via ORAL
  Filled 2018-03-09: qty 1

## 2018-03-09 NOTE — ED Triage Notes (Signed)
C/o body aches, fever x 4 days-n/v/d x today-NAD-steady gait

## 2018-03-09 NOTE — Discharge Instructions (Signed)
Thank you for allowing me to care for you today in the Emergency Department.   Discussed schedule follow-up appointment with your primary care provider for recheck of your symptoms in the next 3 to 4 days.  Take at thousand milligrams of Tylenol every 8 hours for body aches or fever.  Let 1 tablet of Zofran dissolve in your tongue every 6 hours as needed for nausea or vomiting.  You take 1 tablet of loperamide every 6 hours as needed for diarrhea or loose stools.  It is important you continue to eat and drink to avoid dehydration.  Your labs today were very reassuring.  Return to the emergency department if you develop resistant vomiting despite taking Zofran, high fever of greater than 100.5 despite taking thousand milligrams of Tylenol every 8 hours, severe, uncontrollable abdominal pain, bloody or black stools, or other new, concerning symptoms.

## 2018-03-09 NOTE — ED Notes (Signed)
Sprite provided for fluid challenge 

## 2018-03-09 NOTE — ED Provider Notes (Signed)
[facility] EMERGENCY DEPARTMENT Provider Note   CSN: [id number] Arrival date & time: 03/09/18  1649     History   Chief Complaint Chief Complaint  Patient presents with  . Generalized Body Aches    HPI Michelle Hess is a 57 y.o. female with a history of diabetes mellitus type 2, HTN, and diabetic neuropathy who presents to the emergency department with a chief complaint of myalgias.  The patient reports she has been feeling generally unwell with generalized weakness and fatigue for the last 5 days. She reports subjective fever since onset of symptoms. States she checked her temperature at home 5 days ago and it was 99.8. Yesterday, she developed myalgias followed by nausea, chills, non-bloody diarrhea x1 and NBNB emesis x1 today.  She also endorses abdominal pain. States "her stomach feels like it is in knots". Reports she always has some mild abdominal discomfort, but it has been worse over the last few days.  Aggravating factors include eating.  No known alleviating factors.  Surgical history includes cholecystectomy and cesarean section.  She reports she had wheezing earlier this week, which has since resolved and feels as if she has some mucous "stuck" in her throat for the last few days that she has been unable to cough up. No difficulty with eating, drinking, or swallowing. She denies nasal congestion, sore throat, cough, headache, numbness, weakness, dysuria, hematuria, chest pain, dyspnea, hematuria, choking, vaginal pain or discharge, back pain, rhinorrhea, sneezing, otalgia, sinus pain or pressure.  She is a current, every day smoker.  Reports she smokes approximately 8 cigarettes daily as she has been trying to cut back.  She checked her blood sugar last night and it was 168.  Reports it is been running in the 160s to 180s over the last few days.  She has not checked it today.  No known sick contacts.  She is not up-to-date on her influenza vaccination.  The history  is provided by the patient. No language interpreter was used.    Past Medical History:  Diagnosis Date  . Diabetes (HCC)   . Hypertension   . Neuropathy     Patient Active Problem List   Diagnosis Date Noted  . Depression with anxiety 11/07/2017  . Essential hypertension 11/07/2017  . Mixed hyperlipidemia 11/07/2017  . Primary insomnia 11/07/2017  . Tobacco dependence 11/07/2017  . Uncontrolled type 2 diabetes mellitus with peripheral neuropathy (HCC) 11/07/2017  . Vitamin D deficiency 11/07/2017  . Left knee injury, subsequent encounter 08/12/2017  . Upper respiratory tract infection     Past Surgical History:  Procedure Laterality Date  . LEEP       OB History   None      Home Medications    Prior to Admission medications   Medication Sig Start Date End Date Taking? Authorizing Provider  atorvastatin (LIPITOR) 80 MG tablet Take 80 mg by mouth at bedtime. 11/11/17   [provider]  budesonide-formoterol (SYMBICORT) 160-4.5 MCG/ACT inhaler Inhale 2 puffs into the lungs 2 (two) times daily.    [provider]  citalopram (CELEXA) 20 MG tablet Take 20 mg by mouth daily. 11/11/17   [provider]  erythromycin ophthalmic ointment Place a 1/2 inch ribbon of ointment into the lower eyelid. 02/06/18   Horton, Mayer Masker, MD  gabapentin (NEURONTIN) 300 MG capsule Take 300 mg by mouth 3 (three) times daily.    [provider]  Insulin Glargine (LANTUS SOLOSTAR) 100 UNIT/ML Solostar Pen Inject into the  skin. 11/11/17   [provider]  lisinopril-hydrochlorothiazide (PRINZIDE,ZESTORETIC) 20-25 MG tablet Take 1 tablet by mouth daily. 10/29/17   [provider]  loperamide (IMODIUM) 2 MG capsule Take 1 capsule (2 mg total) by mouth 4 (four) times daily as needed for diarrhea or loose stools. 03/09/18   Lurae Hornbrook A, PA-C  metFORMIN (GLUCOPHAGE-XR) 500 MG 24 hr tablet Take by mouth. 11/11/17   [provider]  naproxen  (NAPROSYN) 500 MG tablet Take 1 tablet (500 mg total) by mouth 2 (two) times daily. 02/06/18   Horton, Mayer Masker, MD  omega-3 acid ethyl esters (LOVAZA) 1 g capsule Take 1 capsule (1 g total) by mouth 2 (two) times daily. 02/14/17   Alison Murray, MD  ondansetron (ZOFRAN) 4 MG tablet Take 1 tablet (4 mg total) by mouth every 6 (six) hours. 03/09/18   Shatisha Falter A, PA-C  oxyCODONE-acetaminophen (PERCOCET/ROXICET) 5-325 MG tablet Take 1 tablet by mouth every 4 (four) hours as needed for severe pain. 11/15/17   Lenda Kelp, MD    Family History Family History  Problem Relation Age of Onset  . Pancreatic cancer Mother   . COPD Father     Social History Social History   Tobacco Use  . Smoking status: Current Every Day Smoker    Types: Cigarettes  . Smokeless tobacco: Never Used  Substance Use Topics  . Alcohol use: No  . Drug use: No     Allergies   Patient has no known allergies.   Review of Systems Review of Systems  Constitutional: Positive for chills and fever. Negative for activity change.  HENT: Negative for congestion, ear discharge, hearing loss, sinus pressure, sinus pain, sore throat and trouble swallowing.   Eyes: Negative for visual disturbance.  Respiratory: Positive for wheezing (resolved). Negative for cough, choking and shortness of breath.   Cardiovascular: Negative for chest pain and leg swelling.  Gastrointestinal: Positive for diarrhea, nausea and vomiting. Negative for abdominal distention, abdominal pain, anal bleeding, blood in stool and constipation.  Genitourinary: Negative for dysuria, frequency, hematuria, urgency, vaginal discharge and vaginal pain.  Musculoskeletal: Negative for back pain.  Skin: Negative for rash.  Allergic/Immunologic: Negative for immunocompromised state.  Neurological: Positive for weakness (generalized). Negative for dizziness, numbness and headaches.  Psychiatric/Behavioral: Negative for confusion.   Physical  Exam Updated Vital Signs BP 105/78 (BP Location: Right Arm)   Pulse 98   Temp 99.1 F (37.3 C) (Oral)   Resp 18   Ht 5\' 4"  (1.626 m)   Wt 80.1 kg   SpO2 98%   BMI 30.30 kg/m   Physical Exam  Constitutional: No distress.  HENT:  Head: Normocephalic.  Right Ear: Hearing, tympanic membrane and ear canal normal.  Left Ear: Hearing, tympanic membrane and ear canal normal.  Nose: Nose normal.  Mouth/Throat: Uvula is midline and mucous membranes are normal. Posterior oropharyngeal erythema present. No oropharyngeal exudate, posterior oropharyngeal edema or tonsillar abscesses. No tonsillar exudate.  Eyes: Conjunctivae are normal.  Neck: Normal range of motion. Neck supple.  No meningismus.  Cardiovascular: Normal rate, regular rhythm, normal heart sounds and intact distal pulses. Exam reveals no gallop and no friction rub.  No murmur heard. Pulmonary/Chest: Effort normal. No stridor. No respiratory distress. She has no wheezes. She has no rales. She exhibits no tenderness.  Abdominal: Soft. She exhibits no distension and no mass. There is tenderness. There is no rebound and no guarding. No hernia.  Mild tenderness to palpation to the  bilateral lower quadrants without rebound or guarding.  Abdomen is soft, nondistended.  Upper abdomen is nontender to palpation.  No CVA tenderness bilaterally.  No tenderness over McBurney's point.  Neurological: She is alert.  Skin: Skin is warm. Capillary refill takes less than 2 seconds. No rash noted.  Psychiatric: Her behavior is normal.  Nursing note and vitals reviewed.    ED Treatments / Results  Labs (all labs ordered are listed, but only abnormal results are displayed) Labs Reviewed  COMPREHENSIVE METABOLIC PANEL - Abnormal; Notable for the following components:      Result Value   Potassium 3.1 (*)    BUN 21 (*)    All other components within normal limits  CBC  LIPASE, BLOOD    EKG None  Radiology No results  found.  Procedures Procedures (including critical care time)  Medications Ordered in ED Medications  ondansetron (ZOFRAN-ODT) disintegrating tablet 4 mg (4 mg Oral Given 03/09/18 1738)     Initial Impression / Assessment and Plan / ED Course  I have reviewed the triage vital signs and the nursing notes.  Pertinent labs & imaging results that were available during my care of the patient were reviewed by me and considered in my medical decision making (see chart for details).     57 year old with a history of diabetes mellitus type 2, HTN, and diabetic neuropathy who presents to the emergency department with a subjective fever and chills, nausea, nonbloody, nonbilious emesis, and one episode of nonbloody diarrhea.  She also endorses some nonspecific abdominal discomfort, but abdominal exam is benign.  She does not have a surgical abdomen.  No genitourinary symptoms.  In the ED, she is afebrile, normotensive without tachycardia.  She is in no acute distress.  Zofran given for nausea and she was successfully fluid challenged.  Labs are notable for mild hypokalemia of 3.1 and she has been discharged with a short course of oral potassium chloride.  Labs are otherwise unremarkable.  On reexamination, abdomen remains soft and is nontender to palpation.  She reports that she is feeling much better.  We will discharge home with symptomatic treatment.  Recommended a follow-up appointment with her PCP for recheck in 3 to 4 days. At this time I am uncertain of the etiology of her symptoms, but low suspicion for appendicitis, CAD, diverticulitis, influenza, DKA, HHS, or URI at this time suspect viral etiology.  He is hemodynamically stable and in no acute distress.  She is safe for discharge home with outpatient follow-up at this time.  Final Clinical Impressions(s) / ED Diagnoses   Final diagnoses:  Viral illness    ED Discharge Orders         Ordered    ondansetron (ZOFRAN) 4 MG tablet  Every 6 hours      03/09/18 1942    loperamide (IMODIUM) 2 MG capsule  4 times daily PRN     03/09/18 1942           Treazure Nery A, PA-C 03/10/18 1610    Alvira Monday, MD 03/10/18 1305

## 2018-04-10 ENCOUNTER — Emergency Department (HOSPITAL_BASED_OUTPATIENT_CLINIC_OR_DEPARTMENT_OTHER)
Admission: EM | Admit: 2018-04-10 | Discharge: 2018-04-11 | Disposition: A | Discharge status: Home / Self Care | Payer: Self-pay | Attending: Emergency Medicine | Admitting: Emergency Medicine

## 2018-04-10 ENCOUNTER — Encounter (HOSPITAL_BASED_OUTPATIENT_CLINIC_OR_DEPARTMENT_OTHER): Payer: Self-pay

## 2018-04-10 ENCOUNTER — Other Ambulatory Visit: Payer: Self-pay

## 2018-04-10 DIAGNOSIS — F329 Major depressive disorder, single episode, unspecified: Secondary | ICD-10-CM | POA: Insufficient documentation

## 2018-04-10 DIAGNOSIS — I10 Essential (primary) hypertension: Secondary | ICD-10-CM | POA: Insufficient documentation

## 2018-04-10 DIAGNOSIS — F419 Anxiety disorder, unspecified: Secondary | ICD-10-CM | POA: Insufficient documentation

## 2018-04-10 DIAGNOSIS — Z79899 Other long term (current) drug therapy: Secondary | ICD-10-CM | POA: Insufficient documentation

## 2018-04-10 DIAGNOSIS — M79604 Pain in right leg: Secondary | ICD-10-CM | POA: Insufficient documentation

## 2018-04-10 DIAGNOSIS — Z794 Long term (current) use of insulin: Secondary | ICD-10-CM | POA: Insufficient documentation

## 2018-04-10 DIAGNOSIS — F17210 Nicotine dependence, cigarettes, uncomplicated: Secondary | ICD-10-CM | POA: Insufficient documentation

## 2018-04-10 DIAGNOSIS — M25561 Pain in right knee: Secondary | ICD-10-CM

## 2018-04-10 DIAGNOSIS — R52 Pain, unspecified: Secondary | ICD-10-CM

## 2018-04-10 DIAGNOSIS — E119 Type 2 diabetes mellitus without complications: Secondary | ICD-10-CM | POA: Insufficient documentation

## 2018-04-10 NOTE — ED Triage Notes (Signed)
Pt c/o pain to bilat LE from knees to ankles since she fell in April-NAD- to triage in w/c

## 2018-04-11 ENCOUNTER — Emergency Department (HOSPITAL_BASED_OUTPATIENT_CLINIC_OR_DEPARTMENT_OTHER): Payer: Self-pay

## 2018-04-11 ENCOUNTER — Ambulatory Visit (HOSPITAL_BASED_OUTPATIENT_CLINIC_OR_DEPARTMENT_OTHER)
Admission: RE | Admit: 2018-04-11 | Discharge: 2018-04-11 | Disposition: A | Discharge status: Home / Self Care | Payer: Self-pay | Source: Ambulatory Visit | Attending: Emergency Medicine | Admitting: Emergency Medicine

## 2018-04-11 DIAGNOSIS — M7121 Synovial cyst of popliteal space [Baker], right knee: Secondary | ICD-10-CM

## 2018-04-11 DIAGNOSIS — R52 Pain, unspecified: Secondary | ICD-10-CM

## 2018-04-11 LAB — BASIC METABOLIC PANEL
ANION GAP: 7 (ref 5–15)
BUN: 17 mg/dL (ref 6–20)
CALCIUM: 9.7 mg/dL (ref 8.9–10.3)
CO2: 31 mmol/L (ref 22–32)
Chloride: 101 mmol/L (ref 98–111)
Creatinine, Ser: 1.02 mg/dL — ABNORMAL HIGH (ref 0.44–1.00)
Glucose, Bld: 129 mg/dL — ABNORMAL HIGH (ref 70–99)
Potassium: 2.8 mmol/L — ABNORMAL LOW (ref 3.5–5.1)
Sodium: 139 mmol/L (ref 135–145)

## 2018-04-11 LAB — CK: Total CK: 112 U/L (ref 38–234)

## 2018-04-11 LAB — D-DIMER, QUANTITATIVE: D-Dimer, Quant: 0.86 ug/mL-FEU — ABNORMAL HIGH (ref 0.00–0.50)

## 2018-04-11 MED ORDER — POTASSIUM CHLORIDE ER 10 MEQ PO TBCR: 10.0000 meq | EXTENDED_RELEASE_TABLET | Freq: Two times a day (BID) | ORAL | 0 refills | Status: DC

## 2018-04-11 MED ORDER — HYDROCODONE-ACETAMINOPHEN 5-325 MG PO TABS
1.0000 | ORAL_TABLET | Freq: Once | ORAL | Status: AC
Start: 2018-04-11 — End: 2018-04-11
  Administered 2018-04-11: 1 via ORAL
  Filled 2018-04-11: qty 1

## 2018-04-11 MED ORDER — ENOXAPARIN SODIUM 80 MG/0.8ML ~~LOC~~ SOLN
1.0000 mg/kg | Freq: Once | SUBCUTANEOUS | Status: AC
  Administered 2018-04-11: 80 mg via SUBCUTANEOUS
  Filled 2018-04-11: qty 0.8

## 2018-04-11 MED ORDER — NAPROXEN 500 MG PO TABS: 500.0000 mg | ORAL_TABLET | Freq: Two times a day (BID) | ORAL | 0 refills | Status: DC | PRN

## 2018-04-11 MED ORDER — POTASSIUM CHLORIDE CRYS ER 20 MEQ PO TBCR
40.0000 meq | EXTENDED_RELEASE_TABLET | Freq: Once | ORAL | Status: AC
  Administered 2018-04-11: 40 meq via ORAL
  Filled 2018-04-11: qty 2

## 2018-04-11 NOTE — ED Provider Notes (Signed)
[facility] EMERGENCY DEPARTMENT Provider Note   CSN: [id number] Arrival date & time: 04/10/18  2234     History   Chief Complaint Chief Complaint  Patient presents with  . Leg Pain    HPI Michelle Hess is a 57 y.o. female.  Patient states she is had issues with her left knee since a fall 6 months ago.  Today she has pain in her right knee and right leg after getting out of the bathtub.  Denies any specific fall or injury.  She felt her knee pop when she extended it.  She has pain and swelling to the right knee and right leg more so than the left side.  States she has pain every day in her left knee but the pain in her right knee is new.  Denies any fevers, chills, nausea or vomiting.  Does have a history of diabetes as well as neuropathy..  No focal weakness, numbness or tingling.  She reports pain is had in her right calf and behind her right knee that radiates down the right leg.  Denies any chest pain or trouble breathing.  No history of blood clot.  The history is provided by the patient.  Leg Pain      Past Medical History:  Diagnosis Date  . Diabetes (HCC)   . Hypertension   . Neuropathy     Patient Active Problem List   Diagnosis Date Noted  . Depression with anxiety 11/07/2017  . Essential hypertension 11/07/2017  . Mixed hyperlipidemia 11/07/2017  . Primary insomnia 11/07/2017  . Tobacco dependence 11/07/2017  . Uncontrolled type 2 diabetes mellitus with peripheral neuropathy (HCC) 11/07/2017  . Vitamin D deficiency 11/07/2017  . Left knee injury, subsequent encounter 08/12/2017  . Upper respiratory tract infection     Past Surgical History:  Procedure Laterality Date  . LEEP       OB History   None      Home Medications    Prior to Admission medications   Medication Sig Start Date End Date Taking? Authorizing Provider  atorvastatin (LIPITOR) 80 MG tablet Take 80 mg by mouth at bedtime. 11/11/17   [provider]    budesonide-formoterol (SYMBICORT) 160-4.5 MCG/ACT inhaler Inhale 2 puffs into the lungs 2 (two) times daily.    [provider]  citalopram (CELEXA) 20 MG tablet Take 20 mg by mouth daily. 11/11/17   [provider]  erythromycin ophthalmic ointment Place a 1/2 inch ribbon of ointment into the lower eyelid. 02/06/18   Horton, Mayer Masker, MD  gabapentin (NEURONTIN) 300 MG capsule Take 300 mg by mouth 3 (three) times daily.    [provider]  Insulin Glargine (LANTUS SOLOSTAR) 100 UNIT/ML Solostar Pen Inject into the skin. 11/11/17   [provider]  lisinopril-hydrochlorothiazide (PRINZIDE,ZESTORETIC) 20-25 MG tablet Take 1 tablet by mouth daily. 10/29/17   [provider]  loperamide (IMODIUM) 2 MG capsule Take 1 capsule (2 mg total) by mouth 4 (four) times daily as needed for diarrhea or loose stools. 03/09/18   McDonald, Mia A, PA-C  metFORMIN (GLUCOPHAGE-XR) 500 MG 24 hr tablet Take by mouth. 11/11/17   [provider]  naproxen (NAPROSYN) 500 MG tablet Take 1 tablet (500 mg total) by mouth 2 (two) times daily. 02/06/18   Horton, Mayer Masker, MD  omega-3 acid ethyl esters (LOVAZA) 1 g capsule Take 1 capsule (1 g total) by mouth 2 (two) times daily. 02/14/17   Alison Murray, MD  ondansetron (  ZOFRAN) 4 MG tablet Take 1 tablet (4 mg total) by mouth every 6 (six) hours. 03/09/18   McDonald, Mia A, PA-C  oxyCODONE-acetaminophen (PERCOCET/ROXICET) 5-325 MG tablet Take 1 tablet by mouth every 4 (four) hours as needed for severe pain. 11/15/17   Hudnall, Azucena FallenShane R, MD  potassium chloride SA (K-DUR,KLOR-CON) 20 MEQ tablet Take 1 tablet (20 mEq total) by mouth 2 (two) times daily for 5 days. 03/09/18 03/14/18  McDonald, Coral ElseMia A, PA-C    Family History Family History  Problem Relation Age of Onset  . Pancreatic cancer Mother   . COPD Father     Social History Social History   Tobacco Use  . Smoking status: Current Every Day Smoker    Types: Cigarettes  .  Smokeless tobacco: Never Used  Substance Use Topics  . Alcohol use: No  . Drug use: No     Allergies   Patient has no known allergies.   Review of Systems Review of Systems  Constitutional: Negative for activity change, appetite change and fever.  HENT: Negative for congestion and rhinorrhea.   Eyes: Negative for visual disturbance.  Respiratory: Negative for cough, chest tightness and shortness of breath.   Cardiovascular: Negative for chest pain.  Gastrointestinal: Negative for abdominal pain, nausea and vomiting.  Genitourinary: Negative for dysuria and hematuria.  Musculoskeletal: Positive for arthralgias and myalgias.  Skin: Negative for rash.  Neurological: Negative for dizziness, weakness and headaches.   all other systems are negative except as noted in the HPI and PMH.    Physical Exam Updated Vital Signs BP 127/80 (BP Location: Right Arm)   Pulse 97   Temp 98.7 F (37.1 C) (Oral)   Resp 18   Ht 5\' 4"  (1.626 m)   Wt 80.3 kg   SpO2 94%   BMI 30.38 kg/m   Physical Exam  Constitutional: She is oriented to person, place, and time. She appears well-developed and well-nourished. No distress.  HENT:  Head: Normocephalic and atraumatic.  Mouth/Throat: Oropharynx is clear and moist. No oropharyngeal exudate.  Eyes: Pupils are equal, round, and reactive to light. Conjunctivae and EOM are normal.  Neck: Normal range of motion. Neck supple.  No meningismus.  Cardiovascular: Normal rate, regular rhythm, normal heart sounds and intact distal pulses.  No murmur heard. Pulmonary/Chest: Effort normal and breath sounds normal. No respiratory distress.  Abdominal: Soft. There is no tenderness. There is no rebound and no guarding.  Musculoskeletal: Normal range of motion. She exhibits tenderness. She exhibits no edema.  Slight asymmetric edema of the right leg compared to left.  There is anterior right knee tenderness.  There is no warmth erythema.  Patient able to flex and  extend without difficulty.  No ligament laxity. Able to raise leg and keep knee extended.  Intact DP and PT pulse. Some tenderness to right calf.  Neurological: She is alert and oriented to person, place, and time. No cranial nerve deficit. She exhibits normal muscle tone. Coordination normal.   5/5 strength throughout. CN 2-12 intact.Equal grip strength.   Skin: Skin is warm.  Psychiatric: She has a normal mood and affect. Her behavior is normal.  Nursing note and vitals reviewed.    ED Treatments / Results  Labs (all labs ordered are listed, but only abnormal results are displayed) Labs Reviewed  BASIC METABOLIC PANEL - Abnormal; Notable for the following components:      Result Value   Potassium 2.8 (*)    Glucose, Bld 129 (*)  Creatinine, Ser 1.02 (*)    All other components within normal limits  D-DIMER, QUANTITATIVE (NOT AT [facility]) - Abnormal; Notable for the following components:   D-Dimer, Quant 0.86 (*)    All other components within normal limits  CK    EKG None  Radiology Dg Knee Complete 4 Views Right  Result Date: 04/11/2018 CLINICAL DATA:  57 year old female with right knee pain. EXAM: RIGHT KNEE - COMPLETE 4+ VIEW COMPARISON:  None. FINDINGS: There is no acute fracture or dislocation. Mild osteopenia. No arthritic changes. There is moderate suprapatellar effusion. There is slight fragmentation at the insertion of the patellar tendon on the anterior tibia, chronic. Correlation with point tenderness over the tibial tuberosity recommended. IMPRESSION: 1. No acute fracture or dislocation. 2. Moderate joint effusion. Electronically Signed   By: Elgie Collard M.D.   On: 04/11/2018 01:01    Procedures Procedures (including critical care time)  Medications Ordered in ED Medications  HYDROcodone-acetaminophen (NORCO/VICODIN) 5-325 MG per tablet 1 tablet (has no administration in time range)     Initial Impression / Assessment and Plan / ED Course  I have  reviewed the triage vital signs and the nursing notes.  Pertinent labs & imaging results that were available during my care of the patient were reviewed by me and considered in my medical decision making (see chart for details).    Right knee and leg pain.  Asymmetrically swollen.  Intact distal pulses.  X-ray shows arthritis as well as joint effusion.  Low suspicion for septic joint.  No fever, warmth or erythema.  Range of motion is intact.  D-dimer is positive.  No chest pain or shortness of breath.  Hypokalemia repleted.  Patient will given dose of Lovenox and return tomorrow for lower extremity ultrasound. Follow-up with PCP as well as orthopedics.  Return precautions discussed.  Anti-inflammatories prescribed for home use. Final Clinical Impressions(s) / ED Diagnoses   Final diagnoses:  Right leg pain  Acute pain of right knee    ED Discharge Orders    None       Domenico Achord, Jeannett Senior, MD 04/11/18 [phone]

## 2018-04-11 NOTE — Discharge Instructions (Signed)
Take your anti-inflammatories as prescribed.  Follow-up tomorrow for an ultrasound of your leg to rule out blood clot.  Follow-up with your doctor.  Return to the ED if you have new or worsening symptoms.

## 2018-04-15 ENCOUNTER — Other Ambulatory Visit (HOSPITAL_BASED_OUTPATIENT_CLINIC_OR_DEPARTMENT_OTHER): Payer: Self-pay

## 2018-07-08 ENCOUNTER — Emergency Department (HOSPITAL_BASED_OUTPATIENT_CLINIC_OR_DEPARTMENT_OTHER)
Admission: EM | Admit: 2018-07-08 | Discharge: 2018-07-08 | Disposition: A | Discharge status: Home / Self Care | Payer: Self-pay | Attending: Emergency Medicine | Admitting: Emergency Medicine

## 2018-07-08 ENCOUNTER — Other Ambulatory Visit: Payer: Self-pay

## 2018-07-08 ENCOUNTER — Encounter (HOSPITAL_BASED_OUTPATIENT_CLINIC_OR_DEPARTMENT_OTHER): Payer: Self-pay | Admitting: Emergency Medicine

## 2018-07-08 ENCOUNTER — Emergency Department (HOSPITAL_BASED_OUTPATIENT_CLINIC_OR_DEPARTMENT_OTHER): Payer: Self-pay

## 2018-07-08 DIAGNOSIS — Z794 Long term (current) use of insulin: Secondary | ICD-10-CM | POA: Insufficient documentation

## 2018-07-08 DIAGNOSIS — I10 Essential (primary) hypertension: Secondary | ICD-10-CM | POA: Insufficient documentation

## 2018-07-08 DIAGNOSIS — E119 Type 2 diabetes mellitus without complications: Secondary | ICD-10-CM | POA: Insufficient documentation

## 2018-07-08 DIAGNOSIS — M25561 Pain in right knee: Secondary | ICD-10-CM

## 2018-07-08 DIAGNOSIS — M1711 Unilateral primary osteoarthritis, right knee: Secondary | ICD-10-CM | POA: Insufficient documentation

## 2018-07-08 DIAGNOSIS — F17210 Nicotine dependence, cigarettes, uncomplicated: Secondary | ICD-10-CM | POA: Insufficient documentation

## 2018-07-08 DIAGNOSIS — Z79899 Other long term (current) drug therapy: Secondary | ICD-10-CM | POA: Insufficient documentation

## 2018-07-08 DIAGNOSIS — M1990 Unspecified osteoarthritis, unspecified site: Secondary | ICD-10-CM

## 2018-07-08 LAB — SYNOVIAL CELL COUNT + DIFF, W/ CRYSTALS
Crystals, Fluid: NONE SEEN
Lymphocytes-Synovial Fld: 20 % (ref 0–20)
Monocyte-Macrophage-Synovial Fluid: 73 % (ref 50–90)
Neutrophil, Synovial: 7 % (ref 0–25)
WBC, Synovial: 144 /mm3 (ref 0–200)

## 2018-07-08 LAB — GRAM STAIN

## 2018-07-08 MED ORDER — LIDOCAINE-EPINEPHRINE 2 %-1:100000 IJ SOLN
20.0000 mL | Freq: Once | INTRAMUSCULAR | Status: DC
  Filled 2018-07-08: qty 20

## 2018-07-08 MED ORDER — IBUPROFEN 600 MG PO TABS: 600.0000 mg | ORAL_TABLET | Freq: Three times a day (TID) | ORAL | 0 refills | Status: DC | PRN

## 2018-07-08 MED ORDER — HYDROCODONE-ACETAMINOPHEN 5-325 MG PO TABS: 1.0000 | ORAL_TABLET | Freq: Four times a day (QID) | ORAL | 0 refills | Status: DC | PRN

## 2018-07-08 MED ORDER — IBUPROFEN 200 MG PO TABS
600.0000 mg | ORAL_TABLET | Freq: Once | ORAL | Status: AC
  Administered 2018-07-08: 600 mg via ORAL
  Filled 2018-07-08: qty 1

## 2018-07-08 MED ORDER — OXYCODONE-ACETAMINOPHEN 5-325 MG PO TABS
1.0000 | ORAL_TABLET | Freq: Once | ORAL | Status: AC
  Administered 2018-07-08: 1 via ORAL
  Filled 2018-07-08: qty 1

## 2018-07-08 MED ORDER — LIDOCAINE-EPINEPHRINE (PF) 2 %-1:200000 IJ SOLN
INTRAMUSCULAR | Status: AC
  Administered 2018-07-08: 10 mL
  Filled 2018-07-08: qty 20

## 2018-07-08 NOTE — ED Notes (Signed)
ED Provider at bedside. 

## 2018-07-08 NOTE — ED Provider Notes (Signed)
[facility] EMERGENCY DEPARTMENT Provider Note   CSN: [id number] Arrival date & time: 07/08/18  0751    History   Chief Complaint No chief complaint on file.   HPI Michelle Hess is a 58 y.o. female.     HPI 58 year old female with increasing right knee pain over the past 3 days.  She has had intermittent pain in her knee with associated swelling before in the past but is never lasted this long.  Patient is never had arthrocentesis of her right knee.  No prior history of known gout.  She does develop intermittent pain and swelling in her right ankle as well.  No fevers or chills.  No direct trauma.  No recent injury to the right knee.  Symptoms are moderate to severe in severity and worse with movement.  She feels like her right knee is swollen.   Past Medical History:  Diagnosis Date  . Diabetes (HCC)   . Hypertension   . Neuropathy     Patient Active Problem List   Diagnosis Date Noted  . Depression with anxiety 11/07/2017  . Essential hypertension 11/07/2017  . Mixed hyperlipidemia 11/07/2017  . Primary insomnia 11/07/2017  . Tobacco dependence 11/07/2017  . Uncontrolled type 2 diabetes mellitus with peripheral neuropathy (HCC) 11/07/2017  . Vitamin D deficiency 11/07/2017  . Left knee injury, subsequent encounter 08/12/2017  . Upper respiratory tract infection     Past Surgical History:  Procedure Laterality Date  . CHOLECYSTECTOMY    . LEEP    . SHOULDER SURGERY Right      OB History   No obstetric history on file.      Home Medications    Prior to Admission medications   Medication Sig Start Date End Date Taking? Authorizing Provider  citalopram (CELEXA) 20 MG tablet Take 20 mg by mouth daily. 11/11/17  Yes [provider]  gabapentin (NEURONTIN) 300 MG capsule Take 300 mg by mouth 3 (three) times daily.   Yes [provider]  lisinopril-hydrochlorothiazide (PRINZIDE,ZESTORETIC) 20-25 MG tablet Take 1 tablet by mouth daily.  10/29/17  Yes [provider]  metFORMIN (GLUCOPHAGE-XR) 500 MG 24 hr tablet Take by mouth. 11/11/17  Yes [provider]  atorvastatin (LIPITOR) 80 MG tablet Take 80 mg by mouth at bedtime. 11/11/17   [provider]  HYDROcodone-acetaminophen (NORCO/VICODIN) 5-325 MG tablet Take 1 tablet by mouth every 6 (six) hours as needed for moderate pain. 07/08/18   Azalia Bilis, MD  ibuprofen (ADVIL,MOTRIN) 600 MG tablet Take 1 tablet (600 mg total) by mouth every 8 (eight) hours as needed. 07/08/18   Azalia Bilis, MD  Insulin Glargine (LANTUS SOLOSTAR) 100 UNIT/ML Solostar Pen Inject into the skin. 11/11/17   [provider]  omega-3 acid ethyl esters (LOVAZA) 1 g capsule Take 1 capsule (1 g total) by mouth 2 (two) times daily. 02/14/17   Alison Murray, MD  ondansetron (ZOFRAN) 4 MG tablet Take 1 tablet (4 mg total) by mouth every 6 (six) hours. 03/09/18   McDonald, Mia A, PA-C    Family History Family History  Problem Relation Age of Onset  . Pancreatic cancer Mother   . COPD Father     Social History Social History   Tobacco Use  . Smoking status: Current Every Day Smoker    Packs/day: 0.50    Types: Cigarettes  . Smokeless tobacco: Never Used  Substance Use Topics  . Alcohol use: No  . Drug use: No  Allergies   Patient has no known allergies.   Review of Systems Review of Systems  All other systems reviewed and are negative.    Physical Exam Updated Vital Signs Temp 98.4 F (36.9 C) (Oral)   Resp 16   Ht  (1.626 m)   Wt 79.8 kg   BMI 30.21 kg/m   Physical Exam Vitals signs and nursing note reviewed.  Constitutional:      Appearance: She is well-developed.  HENT:     Head: Normocephalic.  Neck:     Musculoskeletal: Normal range of motion.  Pulmonary:     Effort: Pulmonary effort is normal.  Abdominal:     General: There is no distension.  Musculoskeletal: Normal range of motion.     Comments: Right knee effusion.  Mild  pain with passive and active range of motion the right knee.  Normal pulses right foot.  Lower extremity without swelling as compared to the left.  Compartments are soft.  Mild warmth to the right knee without erythema  Neurological:     Mental Status: She is alert and oriented to person, place, and time.      ED Treatments / Results  Labs (all labs ordered are listed, but only abnormal results are displayed) Labs Reviewed  BODY FLUID CULTURE  GRAM STAIN  GLUCOSE, BODY FLUID OTHER  PROTEIN, BODY FLUID (OTHER)  SYNOVIAL CELL COUNT + DIFF, W/ CRYSTALS    EKG None  Radiology Dg Knee Complete 4 Views Right  Result Date: 07/08/2018 CLINICAL DATA:  Right knee pain for the past 3 days. EXAM: RIGHT KNEE - COMPLETE 4+ VIEW COMPARISON:  Right knee x-rays dated April 11, 2018. FINDINGS: No acute fracture or dislocation. Persistent now moderate to large joint effusion. Unchanged mild medial compartment joint space narrowing. Soft tissues are unremarkable. IMPRESSION: 1. Moderate to large joint effusion.  No acute osseous abnormality. 2. Mild medial compartment osteoarthritis. Electronically Signed   By: Obie Dredge M.D.   On: 07/08/2018 08:40    Procedures .Joint Aspiration/Arthrocentesis Date/Time: 07/08/2018 9:22 AM Performed by: Azalia Bilis, MD Authorized by: Azalia Bilis, MD   Consent:    Consent obtained:  Verbal   Consent given by:  Patient   Risks discussed:  Bleeding, infection, incomplete drainage and pain   Alternatives discussed:  No treatment Location:    Location:  Knee   Knee:  R knee Anesthesia (see MAR for exact dosages):    Anesthesia method:  Local infiltration   Local anesthetic:  Lidocaine 2% WITH epi Procedure details:    Preparation: Patient was prepped and draped in usual sterile fashion     Needle gauge:  18 G   Approach:  Lateral   Aspirate amount:  50cc   Aspirate characteristics:  Yellow and serous   Specimen collected: yes     Post-procedure details:    Dressing:  Adhesive bandage   Patient tolerance of procedure:  Tolerated well, no immediate complications   (including critical care time)  Medications Ordered in ED Medications  lidocaine-EPINEPHrine (XYLOCAINE W/EPI) 2 %-1:100000 (with pres) injection 20 mL (has no administration in time range)  oxyCODONE-acetaminophen (PERCOCET/ROXICET) 5-325 MG per tablet 1 tablet (1 tablet Oral Given 07/08/18 0854)  ibuprofen (ADVIL,MOTRIN) tablet 600 mg (600 mg Oral Given 07/08/18 0854)  lidocaine-EPINEPHrine (XYLOCAINE W/EPI) 2 %-1:200000 (PF) injection (10 mLs  Given 07/08/18 0855)     Initial Impression / Assessment and Plan / ED Course  I have reviewed the triage vital signs and the  nursing notes.  Pertinent labs & imaging results that were available during my care of the patient were reviewed by me and considered in my medical decision making (see chart for details).        Suspect inflammatory arthritis.  Low suspicion for septic arthritis.  Patient feels better after joint aspiration.  I suspect this is an inflammatory arthritis and should be treated with anti-inflammatories and short course of hydrocodone.  Primary care follow-up.  I will follow-up on her synovial fluid and let the patient know the results and report this later in a separate note.  Final Clinical Impressions(s) / ED Diagnoses   Final diagnoses:  Acute pain of right knee  Inflammatory arthritis    ED Discharge Orders         Ordered    HYDROcodone-acetaminophen (NORCO/VICODIN) 5-325 MG tablet  Every 6 hours PRN     07/08/18 0920    ibuprofen (ADVIL,MOTRIN) 600 MG tablet  Every 8 hours PRN     07/08/18 0920           Azalia Bilis, MD 07/08/18 [phone]

## 2018-07-08 NOTE — ED Triage Notes (Signed)
Right knee pain x 3 days , denies recent injury

## 2018-07-09 LAB — GLUCOSE, BODY FLUID OTHER: Glucose, Body Fluid Other: 114 mg/dL

## 2018-07-10 LAB — PROTEIN, BODY FLUID (OTHER): Total Protein, Body Fluid Other: 4 g/dL

## 2018-07-12 ENCOUNTER — Other Ambulatory Visit: Payer: Self-pay

## 2018-07-12 ENCOUNTER — Ambulatory Visit (INDEPENDENT_AMBULATORY_CARE_PROVIDER_SITE_OTHER): Payer: Self-pay | Admitting: Family Medicine

## 2018-07-12 VITALS — BP 145/83 | HR 99 | Ht 64.0 in | Wt 176.0 lb

## 2018-07-12 DIAGNOSIS — M25562 Pain in left knee: Secondary | ICD-10-CM

## 2018-07-12 DIAGNOSIS — M25561 Pain in right knee: Secondary | ICD-10-CM

## 2018-07-12 MED ORDER — METHYLPREDNISOLONE ACETATE 40 MG/ML IJ SUSP
40.0000 mg | Freq: Once | INTRAMUSCULAR | Status: AC
  Administered 2018-07-12: 40 mg via INTRA_ARTICULAR

## 2018-07-12 MED ORDER — HYDROCODONE-ACETAMINOPHEN 5-325 MG PO TABS: 1.0000 | ORAL_TABLET | Freq: Four times a day (QID) | ORAL | 0 refills | Status: DC | PRN

## 2018-07-12 NOTE — Progress Notes (Signed)
PCP: Vista Deck, NP  Subjective:   HPI: Patient is a 59 y.o. female here for bilateral knee pain.  Patient reports for about 1 to 2 weeks she has had onset of right greater than left knee pain with swelling without any acute injury. She had aspiration performed of the right knee which showed no evidence of crystals, infection, or an inflammatory arthropathy. Radiographs of the right knee showed no effusion and medial compartment arthritis. She has been taking ibuprofen and Norco with minimal benefit. She states pain is 10 out of 10 in both knees and sharp, worse with walking. No skin changes or numbness.  Past Medical History:  Diagnosis Date  . Diabetes (HCC)   . Hypertension   . Neuropathy     Current Outpatient Medications on File Prior to Visit  Medication Sig Dispense Refill  . atorvastatin (LIPITOR) 80 MG tablet Take 80 mg by mouth at bedtime.  1  . citalopram (CELEXA) 20 MG tablet Take 20 mg by mouth daily.  0  . gabapentin (NEURONTIN) 300 MG capsule Take 300 mg by mouth 3 (three) times daily.    Marland Kitchen ibuprofen (ADVIL,MOTRIN) 600 MG tablet Take 1 tablet (600 mg total) by mouth every 8 (eight) hours as needed. 15 tablet 0  . Insulin Glargine (LANTUS SOLOSTAR) 100 UNIT/ML Solostar Pen Inject into the skin.    Marland Kitchen lisinopril-hydrochlorothiazide (PRINZIDE,ZESTORETIC) 20-25 MG tablet Take 1 tablet by mouth daily.  1  . metFORMIN (GLUCOPHAGE-XR) 500 MG 24 hr tablet Take by mouth.    . omega-3 acid ethyl esters (LOVAZA) 1 g capsule Take 1 capsule (1 g total) by mouth 2 (two) times daily. 30 capsule 0  . ondansetron (ZOFRAN) 4 MG tablet Take 1 tablet (4 mg total) by mouth every 6 (six) hours. 12 tablet 0   No current facility-administered medications on file prior to visit.     Past Surgical History:  Procedure Laterality Date  . CHOLECYSTECTOMY    . LEEP    . SHOULDER SURGERY Right     No Known Allergies  Social History   Socioeconomic History  . Marital status:  Married    Spouse name: Not on file  . Number of children: Not on file  . Years of education: Not on file  . Highest education level: Not on file  Occupational History  . Not on file  Social Needs  . Financial resource strain: Not on file  . Food insecurity:    Worry: Not on file    Inability: Not on file  . Transportation needs:    Medical: Not on file    Non-medical: Not on file  Tobacco Use  . Smoking status: Current Every Day Smoker    Packs/day: 0.50    Types: Cigarettes  . Smokeless tobacco: Never Used  Substance and Sexual Activity  . Alcohol use: No  . Drug use: No  . Sexual activity: Not on file  Lifestyle  . Physical activity:    Days per week: Not on file    Minutes per session: Not on file  . Stress: Not on file  Relationships  . Social connections:    Talks on phone: Not on file    Gets together: Not on file    Attends religious service: Not on file    Active member of club or organization: Not on file    Attends meetings of clubs or organizations: Not on file    Relationship status: Not on file  . Intimate partner  violence:    Fear of current or ex partner: Not on file    Emotionally abused: Not on file    Physically abused: Not on file    Forced sexual activity: Not on file  Other Topics Concern  . Not on file  Social History Narrative  . Not on file    Family History  Problem Relation Age of Onset  . Pancreatic cancer Mother   . COPD Father     BP (!) 145/83   Pulse 99   Ht 5\' 4"  (1.626 m)   Wt 176 lb (79.8 kg)   BMI 30.21 kg/m   Review of Systems: See HPI above.     Objective:  Physical Exam:  Gen: NAD, comfortable in exam room  Right knee: No gross deformity, ecchymoses.  Mod effusion. TTP medial joint line > lateral and suprapatellar pouch. ROM 0 - 120 degrees with 5/5 strength flexion and extension. Negative ant/post drawers. Negative valgus/varus testing. Negative lachmans. Negative mcmurrays, apleys, patellar  apprehension. NV intact distally.  Left knee: No gross deformity, ecchymoses.  Mild-mod effusion. TTP medial joint line > lateral and suprapatellar pouch. ROM 0 - 120 degrees with 5/5 strength flexion and extension. Negative ant/post drawers. Negative valgus/varus testing. Negative lachmans. Negative mcmurrays, apleys, patellar apprehension. NV intact distally.   Assessment & Plan:  1.  Bilateral knee pain: Secondary to arthritis with effusions.  Reviewed synovial fluid analysis of the right knee and no evidence of gout, pseudogout, infection, and inflammatory arthropathy.  We discussed options and went ahead with aspiration and injection of both knees.  We discussed Tylenol, topical medicines, supplements, Aleve.  Prescription given for Norco and advised not to take with Tylenol.  Follow-up in 1 month.  Icing and compression as needed.  After informed written consent timeout was performed, patient was lying supine on exam table.  Right knee was prepped with alcohol swab.  Utilizing superolateral approach, 3 mL of bupivicaine was used for local anesthesia.  Then using an 18g needle on 60cc syringe,31 mL of clear straw-colored fluid was aspirated from right knee.  Knee was then injected with 3:1 bupivicaine:depomedrol.  Patient tolerated procedure well without immediate complications  After informed written consent timeout was performed, patient was lying supine on exam table.  Left knee was prepped with alcohol swab.  Utilizing superolateral approach, 3 mL of bupivicaine was used for local anesthesia.  Then using an 18g needle on 60cc syringe,19 mL of clear straw-colored fluid was aspirated from left knee.  Knee was then injected with 3:1 bupivicaine:depomedrol.  Patient tolerated procedure well without immediate complications

## 2018-07-12 NOTE — Patient Instructions (Signed)
Your pain is due to arthritis. These are the different medications you can take for this: Tylenol 500mg  1-2 tabs three times a day for pain - do NOT take with hydrocodone. Capsaicin, aspercreme, or biofreeze topically up to four times a day may also help with pain. Some supplements that may help for arthritis: Boswellia extract, curcumin, pycnogenol Aleve 1-2 tabs twice a day with food Cortisone injections are an option - you were given these today. If cortisone injections do not help, there are different types of shots that may help but they take longer to take effect. It's important that you continue to stay active. Straight leg raises, knee extensions 3 sets of 10 once a day (add ankle weight if these become too easy). Consider physical therapy to strengthen muscles around the joint that hurts to take pressure off of the joint itself. Shoe inserts with good arch support may be helpful. Heat or ice 15 minutes at a time 3-4 times a day as needed to help with pain. Water aerobics and cycling with low resistance are the best two types of exercise for arthritis though any exercise is ok as long as it doesn't worsen the pain. Follow up with me in 1 month.

## 2018-07-13 ENCOUNTER — Encounter: Payer: Self-pay | Admitting: Family Medicine

## 2018-07-13 LAB — CULTURE, BODY FLUID W GRAM STAIN -BOTTLE: Culture: NO GROWTH

## 2018-07-13 LAB — CULTURE, BODY FLUID-BOTTLE

## 2018-07-17 ENCOUNTER — Ambulatory Visit: Payer: Self-pay | Admitting: Family Medicine

## 2018-08-14 ENCOUNTER — Ambulatory Visit: Payer: Self-pay | Admitting: Family Medicine

## 2018-08-14 ENCOUNTER — Other Ambulatory Visit: Payer: Self-pay | Admitting: Family Medicine

## 2018-08-14 MED ORDER — DICLOFENAC SODIUM 75 MG PO TBEC: 75.0000 mg | DELAYED_RELEASE_TABLET | Freq: Two times a day (BID) | ORAL | 1 refills | Status: DC

## 2018-08-14 NOTE — Progress Notes (Signed)
Sent diclofenac in for her - please ask that she not take ibuprofen or aleve with this.  Thanks!

## 2018-08-15 ENCOUNTER — Other Ambulatory Visit: Payer: Self-pay

## 2018-08-15 ENCOUNTER — Ambulatory Visit (INDEPENDENT_AMBULATORY_CARE_PROVIDER_SITE_OTHER): Payer: 59 | Admitting: Family Medicine

## 2018-08-15 ENCOUNTER — Encounter: Payer: Self-pay | Admitting: Family Medicine

## 2018-08-15 ENCOUNTER — Telehealth: Payer: Self-pay | Admitting: Family Medicine

## 2018-08-15 VITALS — BP 130/86 | HR 78 | Temp 98.3°F | Ht 64.0 in | Wt 175.0 lb

## 2018-08-15 DIAGNOSIS — M170 Bilateral primary osteoarthritis of knee: Secondary | ICD-10-CM

## 2018-08-15 MED ORDER — CYCLOBENZAPRINE HCL 10 MG PO TABS: 10.0000 mg | ORAL_TABLET | Freq: Three times a day (TID) | ORAL | 0 refills | Status: DC | PRN

## 2018-08-15 NOTE — Patient Instructions (Signed)
Your pain is due to arthritis. These are the different medications you can take for this: Tylenol 500mg  1-2 tabs three times a day for pain. Capsaicin, aspercreme, or biofreeze topically up to four times a day may also help with pain. Some supplements that may help for arthritis: Boswellia extract, curcumin, pycnogenol Diclofenac 75mg  twice a day with food for pain and inflammation. I sent in flexeril for you also. Cortisone injections are an option but the one we gave you a month ago has already worn off. If cortisone injections do not help, there are different types of shots that may help but they take longer to take effect - we will refer you to orthopedics to consider this by the end of the week. It's important that you continue to stay active. Straight leg raises, knee extensions, hamstring curls 3 sets of 10 once a day (add ankle weight if these become too easy). Consider physical therapy to strengthen muscles around the joint that hurts to take pressure off of the joint itself. Ice 15 minutes at a time 3-4 times a day as needed to help with pain. Compression sleeves when up and walking around to help with the swelling and support. Elevate above your heart as much as possible. Follow up with me in 1 month or as needed - you can cancel the orthopedic appointment if you're doing better.

## 2018-08-15 NOTE — Telephone Encounter (Signed)
Spoke to patient and she wants to see ortho. will do referral for ortho.

## 2018-08-15 NOTE — Telephone Encounter (Signed)
Pt called upset about her pain level. Asking for a call back.
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
## 2018-08-16 ENCOUNTER — Ambulatory Visit: Payer: Self-pay | Admitting: Family Medicine

## 2018-08-21 ENCOUNTER — Telehealth: Payer: Self-pay | Admitting: Family Medicine

## 2018-08-21 NOTE — Telephone Encounter (Signed)
You can disregard the previous message. The patient has decided to do something else. FYI.

## 2018-08-21 NOTE — Telephone Encounter (Signed)
This patient has been seeing Dr. Pearletha Forge and his office is not doing any type of Visco Supplementation at this time. Dr. Lazaro Arms nurse has tried to get this patient on Orthovisc but the preferred Gelsyn 3. Dr. Pearletha Forge wants this patient to see another sports medicine or Orthopedic specialist. Do you have any other recommendations?   She has been doing cortisone shots but they are not helping. There is a note about considering Visco supplementation. Please advise.

## 2018-08-23 ENCOUNTER — Other Ambulatory Visit: Payer: Self-pay

## 2018-08-23 ENCOUNTER — Ambulatory Visit (INDEPENDENT_AMBULATORY_CARE_PROVIDER_SITE_OTHER): Payer: 59 | Admitting: Family Medicine

## 2018-08-23 ENCOUNTER — Encounter: Payer: Self-pay | Admitting: Family Medicine

## 2018-08-23 DIAGNOSIS — M170 Bilateral primary osteoarthritis of knee: Secondary | ICD-10-CM

## 2018-08-23 NOTE — Progress Notes (Addendum)
PCP: Vista Deck, NP  Subjective:   HPI: Patient is a 58 y.o. female here for bilateral knee gel injections.  Patient returns with bilateral knee pain - 10/10 on right, 6/10 on left with history of arthritis. Starting gelsyn series today. No skin changes.  Past Medical History:  Diagnosis Date  . Diabetes (HCC)   . Hypertension   . Neuropathy     Current Outpatient Medications on File Prior to Visit  Medication Sig Dispense Refill  . atorvastatin (LIPITOR) 80 MG tablet Take 80 mg by mouth at bedtime.  1  . citalopram (CELEXA) 20 MG tablet Take 20 mg by mouth daily.  0  . cyclobenzaprine (FLEXERIL) 10 MG tablet Take 1 tablet (10 mg total) by mouth 3 (three) times daily as needed for muscle spasms. 60 tablet 0  . diclofenac (VOLTAREN) 75 MG EC tablet Take 1 tablet (75 mg total) by mouth 2 (two) times daily. 60 tablet 1  . gabapentin (NEURONTIN) 300 MG capsule Take 300 mg by mouth 3 (three) times daily.    . Insulin Glargine (LANTUS SOLOSTAR) 100 UNIT/ML Solostar Pen Inject into the skin.    Marland Kitchen lisinopril-hydrochlorothiazide (PRINZIDE,ZESTORETIC) 20-25 MG tablet Take 1 tablet by mouth daily.  1  . metFORMIN (GLUCOPHAGE-XR) 500 MG 24 hr tablet Take by mouth.    . omega-3 acid ethyl esters (LOVAZA) 1 g capsule Take 1 capsule (1 g total) by mouth 2 (two) times daily. 30 capsule 0  . ondansetron (ZOFRAN) 4 MG tablet Take 1 tablet (4 mg total) by mouth every 6 (six) hours. 12 tablet 0   No current facility-administered medications on file prior to visit.     Past Surgical History:  Procedure Laterality Date  . CHOLECYSTECTOMY    . LEEP    . SHOULDER SURGERY Right     No Known Allergies  Social History   Socioeconomic History  . Marital status: Married    Spouse name: Not on file  . Number of children: Not on file  . Years of education: Not on file  . Highest education level: Not on file  Occupational History  . Not on file  Social Needs  . Financial resource  strain: Not on file  . Food insecurity:    Worry: Not on file    Inability: Not on file  . Transportation needs:    Medical: Not on file    Non-medical: Not on file  Tobacco Use  . Smoking status: Current Every Day Smoker    Packs/day: 0.50    Types: Cigarettes  . Smokeless tobacco: Never Used  Substance and Sexual Activity  . Alcohol use: No  . Drug use: No  . Sexual activity: Not on file  Lifestyle  . Physical activity:    Days per week: Not on file    Minutes per session: Not on file  . Stress: Not on file  Relationships  . Social connections:    Talks on phone: Not on file    Gets together: Not on file    Attends religious service: Not on file    Active member of club or organization: Not on file    Attends meetings of clubs or organizations: Not on file    Relationship status: Not on file  . Intimate partner violence:    Fear of current or ex partner: Not on file    Emotionally abused: Not on file    Physically abused: Not on file    Forced sexual activity: Not  on file  Other Topics Concern  . Not on file  Social History Narrative  . Not on file    Family History  Problem Relation Age of Onset  . Pancreatic cancer Mother   . COPD Father     BP 117/77   Pulse 97   Ht 5\' 4"  (1.626 m)   Wt 174 lb (78.9 kg)   BMI 29.87 kg/m   Review of Systems: See HPI above.     Objective:  Physical Exam:  Gen: NAD, comfortable in exam room  Rest of exam not repeated today.   Assessment & Plan:  1. Bilateral knee arthritis - first gelsyn injections given today.  Previously discussed tylenol, topical medications, supplements, nsaids.  Home exercises.  S/p steroid injection to right knee but not >3 months improvement.  F/u in 1 week for second injection.  After informed written consent timeout was performed, patient was lying supine on exam table. Right knee was prepped with alcohol swab and utilizing superolateral approach with ultrasound guidance, patient's right  knee was injected intraarticularly with 3mL bupivicaine followed by gelsyn. Patient tolerated the procedure well without immediate complications.  After informed written consent timeout was performed, patient was lying supine on exam table. Left knee was prepped with alcohol swab and utilizing superolateral approach with ultrasound guidance, patient's left knee was injected intraarticularly with 3mL bupivicaine followed by gelsyn. Patient tolerated the procedure well without immediate complications.

## 2018-08-30 ENCOUNTER — Encounter: Payer: Self-pay | Admitting: Family Medicine

## 2018-08-30 ENCOUNTER — Other Ambulatory Visit: Payer: Self-pay

## 2018-08-30 ENCOUNTER — Ambulatory Visit: Payer: 59 | Admitting: Family Medicine

## 2018-08-30 DIAGNOSIS — M170 Bilateral primary osteoarthritis of knee: Secondary | ICD-10-CM | POA: Diagnosis not present

## 2018-08-30 NOTE — Patient Instructions (Signed)
Your pain is due to arthritis. These are the different medications you can take for this: Tylenol 500mg  1-2 tabs three times a day for pain. Capsaicin, aspercreme, or biofreeze topically up to four times a day may also help with pain. Some supplements that may help for arthritis: Boswellia extract, curcumin, pycnogenol Diclofenac 75mg  twice a day with food for pain and inflammation. Flexeril as needed for spasms. Follow up in 1 week for third and typically final injection.

## 2018-08-30 NOTE — Progress Notes (Signed)
PCP: Vista Deck, NP  Subjective:   HPI: Patient is a 58 y.o. female here for bilateral knee gel injections.  4/22: Patient returns with bilateral knee pain - 10/10 on right, 6/10 on left with history of arthritis. Starting gelsyn series today. No skin changes.  4/29: Patient returns for second gelsyn injections. Mild improvement since last visit - 5/10 pain bilaterally. No skin changes.  Past Medical History:  Diagnosis Date  . Diabetes (HCC)   . Hypertension   . Neuropathy     Current Outpatient Medications on File Prior to Visit  Medication Sig Dispense Refill  . atorvastatin (LIPITOR) 80 MG tablet Take 80 mg by mouth at bedtime.  1  . citalopram (CELEXA) 20 MG tablet Take 20 mg by mouth daily.  0  . cyclobenzaprine (FLEXERIL) 10 MG tablet Take 1 tablet (10 mg total) by mouth 3 (three) times daily as needed for muscle spasms. 60 tablet 0  . diclofenac (VOLTAREN) 75 MG EC tablet Take 1 tablet (75 mg total) by mouth 2 (two) times daily. 60 tablet 1  . gabapentin (NEURONTIN) 300 MG capsule Take 300 mg by mouth 3 (three) times daily.    . Insulin Glargine (LANTUS SOLOSTAR) 100 UNIT/ML Solostar Pen Inject into the skin.    Marland Kitchen lisinopril-hydrochlorothiazide (PRINZIDE,ZESTORETIC) 20-25 MG tablet Take 1 tablet by mouth daily.  1  . metFORMIN (GLUCOPHAGE-XR) 500 MG 24 hr tablet Take by mouth.    . omega-3 acid ethyl esters (LOVAZA) 1 g capsule Take 1 capsule (1 g total) by mouth 2 (two) times daily. 30 capsule 0  . ondansetron (ZOFRAN) 4 MG tablet Take 1 tablet (4 mg total) by mouth every 6 (six) hours. 12 tablet 0   No current facility-administered medications on file prior to visit.     Past Surgical History:  Procedure Laterality Date  . CHOLECYSTECTOMY    . LEEP    . SHOULDER SURGERY Right     No Known Allergies  Social History   Socioeconomic History  . Marital status: Married    Spouse name: Not on file  . Number of children: Not on file  . Years of  education: Not on file  . Highest education level: Not on file  Occupational History  . Not on file  Social Needs  . Financial resource strain: Not on file  . Food insecurity:    Worry: Not on file    Inability: Not on file  . Transportation needs:    Medical: Not on file    Non-medical: Not on file  Tobacco Use  . Smoking status: Current Every Day Smoker    Packs/day: 0.50    Types: Cigarettes  . Smokeless tobacco: Never Used  Substance and Sexual Activity  . Alcohol use: No  . Drug use: No  . Sexual activity: Not on file  Lifestyle  . Physical activity:    Days per week: Not on file    Minutes per session: Not on file  . Stress: Not on file  Relationships  . Social connections:    Talks on phone: Not on file    Gets together: Not on file    Attends religious service: Not on file    Active member of club or organization: Not on file    Attends meetings of clubs or organizations: Not on file    Relationship status: Not on file  . Intimate partner violence:    Fear of current or ex partner: Not on file  Emotionally abused: Not on file    Physically abused: Not on file    Forced sexual activity: Not on file  Other Topics Concern  . Not on file  Social History Narrative  . Not on file    Family History  Problem Relation Age of Onset  . Pancreatic cancer Mother   . COPD Father     BP 116/82   Pulse 98   Ht 5\' 4"  (1.626 m)   Wt 172 lb (78 kg)   BMI 29.52 kg/m   Review of Systems: See HPI above.     Objective:  Physical Exam:  Gen: NAD, comfortable in exam room  Rest of exam not repeated today.   Assessment & Plan:  1. Bilateral knee arthritis - second gelsyn injections given today.  Previously discussed tylenol, topical medications, supplements, nsaids, home exercises.  F/u in 1 week for third injection.  After informed written consent timeout was performed, patient was seated on exam table. Right knee was prepped with alcohol swab and utilizing  anteromedial approach, patient's right knee was injected intraarticularly with 3mL bupivicaine followed by gelsyn. Patient tolerated the procedure well without immediate complications.  After informed written consent timeout was performed, patient was seated on exam table. Left knee was prepped with alcohol swab and utilizing anteromedial approach, patient's left knee was injected intraarticularly with 3mL bupivicaine followed by gelsyn. Patient tolerated the procedure well without immediate complications.

## 2018-09-06 ENCOUNTER — Ambulatory Visit: Payer: 59 | Admitting: Family Medicine

## 2018-09-06 ENCOUNTER — Encounter: Payer: Self-pay | Admitting: Family Medicine

## 2018-09-06 ENCOUNTER — Other Ambulatory Visit: Payer: Self-pay

## 2018-09-06 DIAGNOSIS — M170 Bilateral primary osteoarthritis of knee: Secondary | ICD-10-CM

## 2018-09-06 NOTE — Progress Notes (Signed)
PCP: Vista DeckIheanacho, Celestina, NP  Subjective:   HPI: Patient is a 58 y.o. female here for bilateral knee gel injections.  4/22: Patient returns with bilateral knee pain - 10/10 on right, 6/10 on left with history of arthritis. Starting gelsyn series today. No skin changes.  4/29: Patient returns for second gelsyn injections. Mild improvement since last visit - 5/10 pain bilaterally. No skin changes.  5/6: Patient returns for third gelsyn injections. No skin changes. Pain down to 3/10 on left, 0/10 on right.  Past Medical History:  Diagnosis Date  . Diabetes (HCC)   . Hypertension   . Neuropathy     Current Outpatient Medications on File Prior to Visit  Medication Sig Dispense Refill  . atorvastatin (LIPITOR) 80 MG tablet Take 80 mg by mouth at bedtime.  1  . citalopram (CELEXA) 20 MG tablet Take 20 mg by mouth daily.  0  . cyclobenzaprine (FLEXERIL) 10 MG tablet Take 1 tablet (10 mg total) by mouth 3 (three) times daily as needed for muscle spasms. 60 tablet 0  . diclofenac (VOLTAREN) 75 MG EC tablet Take 1 tablet (75 mg total) by mouth 2 (two) times daily. 60 tablet 1  . gabapentin (NEURONTIN) 300 MG capsule Take 300 mg by mouth 3 (three) times daily.    . Insulin Glargine (LANTUS SOLOSTAR) 100 UNIT/ML Solostar Pen Inject into the skin.    Marland Kitchen. lisinopril-hydrochlorothiazide (PRINZIDE,ZESTORETIC) 20-25 MG tablet Take 1 tablet by mouth daily.  1  . metFORMIN (GLUCOPHAGE-XR) 500 MG 24 hr tablet Take by mouth.    . omega-3 acid ethyl esters (LOVAZA) 1 g capsule Take 1 capsule (1 g total) by mouth 2 (two) times daily. 30 capsule 0  . ondansetron (ZOFRAN) 4 MG tablet Take 1 tablet (4 mg total) by mouth every 6 (six) hours. 12 tablet 0   No current facility-administered medications on file prior to visit.     Past Surgical History:  Procedure Laterality Date  . CHOLECYSTECTOMY    . LEEP    . SHOULDER SURGERY Right     No Known Allergies  Social History   Socioeconomic  History  . Marital status: Married    Spouse name: Not on file  . Number of children: Not on file  . Years of education: Not on file  . Highest education level: Not on file  Occupational History  . Not on file  Social Needs  . Financial resource strain: Not on file  . Food insecurity:    Worry: Not on file    Inability: Not on file  . Transportation needs:    Medical: Not on file    Non-medical: Not on file  Tobacco Use  . Smoking status: Current Every Day Smoker    Packs/day: 0.50    Types: Cigarettes  . Smokeless tobacco: Never Used  Substance and Sexual Activity  . Alcohol use: No  . Drug use: No  . Sexual activity: Not on file  Lifestyle  . Physical activity:    Days per week: Not on file    Minutes per session: Not on file  . Stress: Not on file  Relationships  . Social connections:    Talks on phone: Not on file    Gets together: Not on file    Attends religious service: Not on file    Active member of club or organization: Not on file    Attends meetings of clubs or organizations: Not on file    Relationship status: Not on  file  . Intimate partner violence:    Fear of current or ex partner: Not on file    Emotionally abused: Not on file    Physically abused: Not on file    Forced sexual activity: Not on file  Other Topics Concern  . Not on file  Social History Narrative  . Not on file    Family History  Problem Relation Age of Onset  . Pancreatic cancer Mother   . COPD Father     BP 111/79   Pulse 99   Ht 5\' 4"  (1.626 m)   Wt 173 lb (78.5 kg)   BMI 29.70 kg/m   Review of Systems: See HPI above.     Objective:  Physical Exam:  Gen: NAD, comfortable in exam room  Rest of exam not repeated today.   Assessment & Plan:  1. Bilateral knee arthritis - third gelsyn injections given today.  Previously discussed tylenol, topical medications, supplements, nsaids, home exercises.  Let us know how she's doing in 4 weeks.    After informed written  consent timeout was performed, patient was seated on exam table. Left knee was prepped with alcohol swab and utilizing anteromedial approach, patient's left knee was injected intraarticularly with 5mL bupivicaine followed by gelsyn. Patient tolerated the procedure well without immediate complications.  After informed written consent timeout was performed, patient was lying supine on exam table. Right knee was prepped with alcohol swab and utilizing superolateral approach with ultrasound guidance, patient's right knee was injected intraarticularly with 61mL bupivicaine followed by gelsyn. Patient tolerated the procedure well without immediate complications.

## 2018-09-26 ENCOUNTER — Telehealth: Payer: Self-pay | Admitting: Family Medicine

## 2018-09-26 NOTE — Telephone Encounter (Signed)
Patient called stating she is still having pain in her knees. She is asking to get the fourth gelsyn injection

## 2018-09-26 NOTE — Telephone Encounter (Signed)
Ok to get this and go ahead.  Thanks!

## 2018-09-27 ENCOUNTER — Encounter: Payer: Self-pay | Admitting: Family Medicine

## 2018-09-27 ENCOUNTER — Other Ambulatory Visit: Payer: Self-pay

## 2018-09-27 ENCOUNTER — Ambulatory Visit: Payer: 59 | Admitting: Family Medicine

## 2018-09-27 DIAGNOSIS — M170 Bilateral primary osteoarthritis of knee: Secondary | ICD-10-CM

## 2018-09-27 MED ORDER — HYDROCODONE-ACETAMINOPHEN 5-325 MG PO TABS: 1.0000 | ORAL_TABLET | Freq: Four times a day (QID) | ORAL | 0 refills | Status: DC | PRN

## 2018-09-27 NOTE — Patient Instructions (Signed)
We will go ahead with an MRI of your worse right knee. I will contact you with results and next steps. Take norco as needed - no driving or working on this.  And don't take tylenol with this. Ok to take aleve OR ibuprofen though - you can stop the diclofenac if it's not helping.

## 2018-09-30 ENCOUNTER — Encounter: Payer: Self-pay | Admitting: Family Medicine

## 2018-09-30 NOTE — Progress Notes (Signed)
PCP: Vista DeckIheanacho, Celestina, NP  Subjective:   HPI: Patient is a 58 y.o. female here for bilateral knee gel injections.  4/22: Patient returns with bilateral knee pain - 10/10 on right, 6/10 on left with history of arthritis. Starting gelsyn series today. No skin changes.  4/29: Patient returns for second gelsyn injections. Mild improvement since last visit - 5/10 pain bilaterally. No skin changes.  5/6: Patient returns for third gelsyn injections. No skin changes. Pain down to 3/10 on left, 0/10 on right.  5/30: Patient returns with 10/10 pain in both knees anteriorly. We went ahead with 4th gelsyn injections.  Past Medical History:  Diagnosis Date  . Diabetes (HCC)   . Hypertension   . Neuropathy     Current Outpatient Medications on File Prior to Visit  Medication Sig Dispense Refill  . atorvastatin (LIPITOR) 80 MG tablet Take 80 mg by mouth at bedtime.  1  . busPIRone (BUSPAR) 15 MG tablet Take 15 mg by mouth 3 (three) times daily as needed.    . citalopram (CELEXA) 20 MG tablet Take 20 mg by mouth daily.  0  . cyclobenzaprine (FLEXERIL) 10 MG tablet Take 1 tablet (10 mg total) by mouth 3 (three) times daily as needed for muscle spasms. 60 tablet 0  . diclofenac (VOLTAREN) 75 MG EC tablet Take 1 tablet (75 mg total) by mouth 2 (two) times daily. 60 tablet 1  . gabapentin (NEURONTIN) 300 MG capsule Take 300 mg by mouth 3 (three) times daily.    . Insulin Glargine (LANTUS SOLOSTAR) 100 UNIT/ML Solostar Pen Inject into the skin.    Marland Kitchen. lisinopril-hydrochlorothiazide (PRINZIDE,ZESTORETIC) 20-25 MG tablet Take 1 tablet by mouth daily.  1  . metFORMIN (GLUCOPHAGE-XR) 500 MG 24 hr tablet Take by mouth.    . omega-3 acid ethyl esters (LOVAZA) 1 g capsule Take 1 capsule (1 g total) by mouth 2 (two) times daily. 30 capsule 0  . ondansetron (ZOFRAN) 4 MG tablet Take 1 tablet (4 mg total) by mouth every 6 (six) hours. 12 tablet 0   No current facility-administered medications on  file prior to visit.     Past Surgical History:  Procedure Laterality Date  . CHOLECYSTECTOMY    . LEEP    . SHOULDER SURGERY Right     No Known Allergies  Social History   Socioeconomic History  . Marital status: Married    Spouse name: Not on file  . Number of children: Not on file  . Years of education: Not on file  . Highest education level: Not on file  Occupational History  . Not on file  Social Needs  . Financial resource strain: Not on file  . Food insecurity:    Worry: Not on file    Inability: Not on file  . Transportation needs:    Medical: Not on file    Non-medical: Not on file  Tobacco Use  . Smoking status: Current Every Day Smoker    Packs/day: 0.50    Types: Cigarettes  . Smokeless tobacco: Never Used  Substance and Sexual Activity  . Alcohol use: No  . Drug use: No  . Sexual activity: Not on file  Lifestyle  . Physical activity:    Days per week: Not on file    Minutes per session: Not on file  . Stress: Not on file  Relationships  . Social connections:    Talks on phone: Not on file    Gets together: Not on file  Attends religious service: Not on file    Active member of club or organization: Not on file    Attends meetings of clubs or organizations: Not on file    Relationship status: Not on file  . Intimate partner violence:    Fear of current or ex partner: Not on file    Emotionally abused: Not on file    Physically abused: Not on file    Forced sexual activity: Not on file  Other Topics Concern  . Not on file  Social History Narrative  . Not on file    Family History  Problem Relation Age of Onset  . Pancreatic cancer Mother   . COPD Father     BP 124/79   Pulse 79   Ht 5\' 4"  (1.626 m)   Wt 172 lb (78 kg)   BMI 29.52 kg/m   Review of Systems: See HPI above.     Objective:  Physical Exam:  Gen: NAD, comfortable in exam room  Right knee: Mild effusion.  No other gross deformity, ecchymoses. Diffuse TTP worst  medial joint line. ROM 0 - 110 degrees. Negative ant/post drawers. Negative valgus/varus testing. Negative lachmans. Medial pain with mcmurrays. NV intact distally.  Rest of exam not repeated today.   Assessment & Plan:  1. Bilateral knee arthritis - fourth gelsyn injections given today but clearly not getting relief from these.  Concern her arthritis may be worse than is seen on radiographs and/or she may have concurrent meniscal tear.  Will go ahead with MRI of more symptomatic right knee, likely refer to ortho following this.    After informed written consent timeout was performed, patient was lying supine on exam table. Right knee was prepped with alcohol swab and utilizing superolateral approach with ultrasound guidance, patient's right knee was injected intraarticularly with 38mL bupivicaine followed by gelsyn. Patient tolerated the procedure well without immediate complications.  After informed written consent timeout was performed, patient was lying supine on exam table. Left knee was prepped with alcohol swab and utilizing superolateral approach with ultrasound guidance, patient's left knee was injected intraarticularly with 32mL bupivicaine followed by gelsyn. Patient tolerated the procedure well without immediate complications.

## 2018-10-04 NOTE — Addendum Note (Signed)
Addended by: Kathi Simpers F on: 10/04/2018 07:57 AM   Modules accepted: Orders

## 2018-10-11 ENCOUNTER — Emergency Department (HOSPITAL_BASED_OUTPATIENT_CLINIC_OR_DEPARTMENT_OTHER)
Admission: EM | Admit: 2018-10-11 | Discharge: 2018-10-11 | Disposition: A | Discharge status: Home / Self Care | Payer: 59 | Attending: Emergency Medicine | Admitting: Emergency Medicine

## 2018-10-11 ENCOUNTER — Encounter (HOSPITAL_BASED_OUTPATIENT_CLINIC_OR_DEPARTMENT_OTHER): Payer: Self-pay

## 2018-10-11 ENCOUNTER — Other Ambulatory Visit: Payer: Self-pay

## 2018-10-11 DIAGNOSIS — R519 Headache, unspecified: Secondary | ICD-10-CM

## 2018-10-11 DIAGNOSIS — Z79899 Other long term (current) drug therapy: Secondary | ICD-10-CM | POA: Diagnosis not present

## 2018-10-11 DIAGNOSIS — Z20828 Contact with and (suspected) exposure to other viral communicable diseases: Secondary | ICD-10-CM | POA: Insufficient documentation

## 2018-10-11 DIAGNOSIS — E876 Hypokalemia: Secondary | ICD-10-CM | POA: Diagnosis not present

## 2018-10-11 DIAGNOSIS — M7918 Myalgia, other site: Secondary | ICD-10-CM | POA: Diagnosis not present

## 2018-10-11 DIAGNOSIS — I10 Essential (primary) hypertension: Secondary | ICD-10-CM | POA: Diagnosis not present

## 2018-10-11 DIAGNOSIS — R51 Headache: Secondary | ICD-10-CM | POA: Diagnosis not present

## 2018-10-11 DIAGNOSIS — F17210 Nicotine dependence, cigarettes, uncomplicated: Secondary | ICD-10-CM | POA: Diagnosis not present

## 2018-10-11 DIAGNOSIS — E119 Type 2 diabetes mellitus without complications: Secondary | ICD-10-CM | POA: Insufficient documentation

## 2018-10-11 DIAGNOSIS — R52 Pain, unspecified: Secondary | ICD-10-CM

## 2018-10-11 LAB — COMPREHENSIVE METABOLIC PANEL
ALT: 15 U/L (ref 0–44)
AST: 14 U/L — ABNORMAL LOW (ref 15–41)
Albumin: 3.9 g/dL (ref 3.5–5.0)
Alkaline Phosphatase: 89 U/L (ref 38–126)
Anion gap: 8 (ref 5–15)
BUN: 17 mg/dL (ref 6–20)
CO2: 28 mmol/L (ref 22–32)
Calcium: 9.8 mg/dL (ref 8.9–10.3)
Chloride: 106 mmol/L (ref 98–111)
Creatinine, Ser: 0.88 mg/dL (ref 0.44–1.00)
GFR calc Af Amer: >60 mL/min (ref >60)
GFR calc non Af Amer: >60 mL/min (ref >60)
Glucose, Bld: 172 mg/dL — ABNORMAL HIGH (ref 70–99)
Potassium: 3.1 mmol/L — ABNORMAL LOW (ref 3.5–5.1)
Sodium: 142 mmol/L (ref 135–145)
Total Bilirubin: 0.8 mg/dL (ref 0.3–1.2)
Total Protein: 7.2 g/dL (ref 6.5–8.1)

## 2018-10-11 LAB — CBC WITH DIFFERENTIAL/PLATELET
Abs Immature Granulocytes: 0.01 10*3/uL (ref 0.00–0.07)
Basophils Absolute: 0 10*3/uL (ref 0.0–0.1)
Basophils Relative: 1 %
Eosinophils Absolute: 0.1 10*3/uL (ref 0.0–0.5)
Eosinophils Relative: 2 %
HCT: 41.3 % (ref 36.0–46.0)
Hemoglobin: 13.2 g/dL (ref 12.0–15.0)
Immature Granulocytes: 0 %
Lymphocytes Relative: 54 %
Lymphs Abs: 3.8 10*3/uL (ref 0.7–4.0)
MCH: 30 pg (ref 26.0–34.0)
MCHC: 32 g/dL (ref 30.0–36.0)
MCV: 93.9 fL (ref 80.0–100.0)
Monocytes Absolute: 0.3 10*3/uL (ref 0.1–1.0)
Monocytes Relative: 4 %
Neutro Abs: 2.7 10*3/uL (ref 1.7–7.7)
Neutrophils Relative %: 39 %
Platelets: 242 10*3/uL (ref 150–400)
RBC: 4.4 MIL/uL (ref 3.87–5.11)
RDW: 14 % (ref 11.5–15.5)
WBC: 6.9 10*3/uL (ref 4.0–10.5)
nRBC: 0 % (ref 0.0–0.2)

## 2018-10-11 LAB — URINALYSIS, ROUTINE W REFLEX MICROSCOPIC
Bilirubin Urine: NEGATIVE
Glucose, UA: NEGATIVE mg/dL
Ketones, ur: NEGATIVE mg/dL
Leukocytes,Ua: NEGATIVE
Nitrite: NEGATIVE
Protein, ur: NEGATIVE mg/dL
Specific Gravity, Urine: 1.025 (ref 1.005–1.030)
pH: 6 (ref 5.0–8.0)

## 2018-10-11 LAB — URINALYSIS, MICROSCOPIC (REFLEX): WBC, UA: NONE SEEN WBC/hpf (ref 0–5)

## 2018-10-11 LAB — CBG MONITORING, ED: Glucose-Capillary: 157 mg/dL — ABNORMAL HIGH (ref 70–99)

## 2018-10-11 LAB — LIPASE, BLOOD: Lipase: 28 U/L (ref 11–51)

## 2018-10-11 MED ORDER — POTASSIUM CHLORIDE CRYS ER 20 MEQ PO TBCR
40.0000 meq | EXTENDED_RELEASE_TABLET | Freq: Once | ORAL | Status: AC
  Administered 2018-10-11: 40 meq via ORAL
  Filled 2018-10-11: qty 2

## 2018-10-11 MED ORDER — PROCHLORPERAZINE EDISYLATE 10 MG/2ML IJ SOLN
10.0000 mg | Freq: Once | INTRAMUSCULAR | Status: AC
  Administered 2018-10-11: 16:00:00 10 mg via INTRAVENOUS
  Filled 2018-10-11: qty 2

## 2018-10-11 MED ORDER — DIPHENHYDRAMINE HCL 50 MG/ML IJ SOLN
25.0000 mg | Freq: Once | INTRAMUSCULAR | Status: AC
  Administered 2018-10-11: 16:00:00 25 mg via INTRAVENOUS
  Filled 2018-10-11: qty 1

## 2018-10-11 MED ORDER — POTASSIUM CHLORIDE CRYS ER 20 MEQ PO TBCR: 20.0000 meq | EXTENDED_RELEASE_TABLET | Freq: Two times a day (BID) | ORAL | 0 refills | Status: DC

## 2018-10-11 MED ORDER — SODIUM CHLORIDE 0.9 % IV BOLUS
1000.0000 mL | Freq: Once | INTRAVENOUS | Status: AC
  Administered 2018-10-11: 16:00:00 1000 mL via INTRAVENOUS

## 2018-10-11 NOTE — ED Provider Notes (Signed)
[facility]PART[facility] Provider Note   CSN: [id number] Arrival date & time: 10/11/18  1533    History   Chief Complaint Chief Complaint  Patient presents with  . Hyperglycemia    HPI Michelle Hess is a 58 y.o. female.     HPI   58 year old female with a history of diabetes, hypertension, hyperlipidemia, tobacco dependence, who presents with concern for hyperglycemia, headache, left upper quadrant abdominal pain and body aches.  Reports 1 week ago had her Lantus changed to basilar due to insurance not covering her medication.  Reports for the last 3 days, she has had increased urinary frequency, increased thirst.  Reports her blood sugar was elevated to 285 just prior to arrival.  Did not take any insulin prior to arrival.  Reports headache is a 7 out of 10.  Has a history of migraines, and this headache feels similar.  Reports she has photophobia and aching pain extending along the left side of her head.  Reports she did trip and fall getting out of bed this morning, but did not hit her head or lose consciousness.  She is not on anticoagulation.  Denies any numbness, weakness, trouble talking or walking.  Reports she is had diffuse body aches.  Described a sensation of bubbling in her stomach and left upper quadrant discomfort.  Reports some loose stool.  Says since she has had her gallbladder removed, she will intermittently have these types of symptoms.  Denies fevers, chills, cough, shortness of breath, chest pain, nausea, vomiting, loss of smell or taste.  Past Medical History:  Diagnosis Date  . Diabetes ([place])   . Hypertension   . Neuropathy     Patient Active Problem List   Diagnosis Date Noted  . Depression with anxiety 11/07/2017  . Essential hypertension 11/07/2017  . Mixed hyperlipidemia 11/07/2017  . Primary insomnia 11/07/2017  . Tobacco dependence 11/07/2017  . Uncontrolled type 2 diabetes mellitus with peripheral neuropathy ([place]) 11/07/2017  .  Vitamin D deficiency 11/07/2017  . Left knee injury, subsequent encounter 08/12/2017  . Upper respiratory tract infection     Past Surgical History:  Procedure Laterality Date  . CHOLECYSTECTOMY    . LEEP    . SHOULDER SURGERY Right      OB History   No obstetric history on file.      Home Medications    Prior to Admission medications   Medication Sig Start Date End Date Taking? Authorizing Provider  atorvastatin (LIPITOR) 80 MG tablet Take 80 mg by mouth at bedtime. 11/11/17   [provider]  busPIRone (BUSPAR) 15 MG tablet Take 15 mg by mouth 3 (three) times daily as needed. 09/14/18   [provider]  citalopram (CELEXA) 20 MG tablet Take 20 mg by mouth daily. 11/11/17   [provider]  diclofenac (VOLTAREN) 75 MG EC tablet Take 1 tablet (75 mg total) by mouth 2 (two) times daily. 08/14/18   Hudnall, Sharyn Lull, MD  gabapentin (NEURONTIN) 300 MG capsule Take 300 mg by mouth 3 (three) times daily.    [provider]  lisinopril-hydrochlorothiazide (PRINZIDE,ZESTORETIC) 20-25 MG tablet Take 1 tablet by mouth daily. 10/29/17   [provider]  metFORMIN (GLUCOPHAGE-XR) 500 MG 24 hr tablet Take by mouth. 11/11/17   [provider]  potassium chloride SA (K-DUR) 20 MEQ tablet Take 1 tablet (20 mEq total) by mouth 2 (two) times daily for 2 days. 10/11/18 10/13/18  Gareth Morgan, MD    Family History  Family History  Problem Relation Age of Onset  . Pancreatic cancer Mother   . COPD Father     Social History Social History   Tobacco Use  . Smoking status: Current Every Day Smoker    Packs/day: 0.50    Types: Cigarettes  . Smokeless tobacco: Never Used  Substance Use Topics  . Alcohol use: No  . Drug use: No     Allergies   Patient has no known allergies.   Review of Systems Review of Systems  Constitutional: Positive for fatigue. Negative for fever.  HENT: Negative for sore throat.   Eyes: Negative for visual  disturbance.  Respiratory: Negative for cough and shortness of breath.   Cardiovascular: Negative for chest pain.  Gastrointestinal: Positive for abdominal pain (discomfort "bubbling") and diarrhea (loose stool). Negative for constipation and vomiting. Nausea: denies nausea, but reports some feeling of stomach upset/bubbling.  Endocrine: Positive for polydipsia and polyuria.  Genitourinary: Negative for difficulty urinating and dysuria.  Musculoskeletal: Positive for myalgias. Negative for back pain and neck pain.  Skin: Negative for rash.  Neurological: Positive for headaches. Negative for syncope, facial asymmetry, weakness and numbness.     Physical Exam Updated Vital Signs BP 128/84 (BP Location: Left Arm)   Pulse 70   Temp 97.9 F (36.6 C) (Oral)   Resp 16   Ht 5\' 4"  (1.626 m)   Wt 81.6 kg   SpO2 99%   BMI 30.90 kg/m   Physical Exam Vitals signs and nursing note reviewed.  Constitutional:      General: She is not in acute distress.    Appearance: She is well-developed. She is not diaphoretic.  HENT:     Head: Normocephalic and atraumatic.  Eyes:     Conjunctiva/sclera: Conjunctivae normal.  Neck:     Musculoskeletal: Normal range of motion.  Cardiovascular:     Rate and Rhythm: Normal rate and regular rhythm.  Pulmonary:     Effort: Pulmonary effort is normal. No respiratory distress.  Abdominal:     General: There is no distension.     Palpations: Abdomen is soft.     Tenderness: There is no abdominal tenderness (mild LUQ). There is no guarding.  Musculoskeletal:        General: No tenderness.  Skin:    General: Skin is warm and dry.     Findings: No erythema or rash.  Neurological:     Mental Status: She is alert and oriented to person, place, and time.      ED Treatments / Results  Labs (all labs ordered are listed, but only abnormal results are displayed) Labs Reviewed  COMPREHENSIVE METABOLIC PANEL - Abnormal; Notable for the following components:       Result Value   Potassium 3.1 (*)    Glucose, Bld 172 (*)    AST 14 (*)    All other components within normal limits  URINALYSIS, ROUTINE W REFLEX MICROSCOPIC - Abnormal; Notable for the following components:   Hgb urine dipstick SMALL (*)    All other components within normal limits  URINALYSIS, MICROSCOPIC (REFLEX) - Abnormal; Notable for the following components:   Bacteria, UA RARE (*)    All other components within normal limits  CBG MONITORING, ED - Abnormal; Notable for the following components:   Glucose-Capillary 157 (*)    All other components within normal limits  NOVEL CORONAVIRUS, NAA (HOSPITAL ORDER, SEND-OUT TO REF LAB)  CBC WITH DIFFERENTIAL/PLATELET  LIPASE, BLOOD    EKG None  Radiology No results found.  Procedures Procedures (including critical care time)  Medications Ordered in ED Medications  sodium chloride 0.9 % bolus 1,000 mL (0 mLs Intravenous Stopped 10/11/18 1714)  prochlorperazine (COMPAZINE) injection 10 mg (10 mg Intravenous Given 10/11/18 1611)  diphenhydrAMINE (BENADRYL) injection 25 mg (25 mg Intravenous Given 10/11/18 1610)  potassium chloride SA (K-DUR) CR tablet 40 mEq (40 mEq Oral Given 10/11/18 1818)     Initial Impression / Assessment and Plan / ED Course  I have reviewed the triage vital signs and the nursing notes.  Pertinent labs & imaging results that were available during my care of the patient were reviewed by me and considered in my medical decision making (see chart for details).        58 year old female with a history of diabetes, hypertension, hyperlipidemia, tobacco dependence, who presents with concern for hyperglycemia, headache, left upper quadrant abdominal pain and body aches.  Reports headache is similar to migraines, denies neurologic symptoms, headache present prior to mechanical fall today and not on anticoagulation-doubt intracranial hemorrhage. No fevers, doubt meningitis.  She has mild left upper quadrant  abdominal pain, feel this is most consistent with gastritis.  No signs of acute intra-abdominal process.  Labs obtained show no sign of DKA or significant hyperglycemia.  Her potassium is 3.1, and suspect this may be contributing to her body aches.  Did send coronavirus testing given presence of body aches, however if negative do not feel she needs to continue to self quarantine as overall low suspicion for COVID19.  No sign of urinary tract infection. Headache cocktail and fluids in the emergency department with improvement of symptoms.  Recommend follow-up with primary care physician. Patient discharged in stable condition with understanding of reasons to return.    Final Clinical Impressions(s) / ED Diagnoses   Final diagnoses:  Acute nonintractable headache, unspecified headache type  Body aches  Hypokalemia    ED Discharge Orders         Ordered    potassium chloride SA (K-DUR) 20 MEQ tablet  2 times daily     10/11/18 1818           Alvira MondaySchlossman, Flornce Record, MD 10/12/18 1215

## 2018-10-11 NOTE — ED Notes (Signed)
ED Provider at bedside. 

## 2018-10-11 NOTE — ED Triage Notes (Signed)
Pt c/o elevated BS-was 285 just PTA-also c/o HA-to triage in w/c

## 2018-10-12 LAB — NOVEL CORONAVIRUS, NAA (HOSP ORDER, SEND-OUT TO REF LAB; TAT 18-24 HRS): SARS-CoV-2, NAA: NOT DETECTED

## 2019-02-14 ENCOUNTER — Ambulatory Visit (INDEPENDENT_AMBULATORY_CARE_PROVIDER_SITE_OTHER): Payer: 59 | Admitting: Family Medicine

## 2019-02-14 ENCOUNTER — Encounter: Payer: Self-pay | Admitting: Family Medicine

## 2019-02-14 ENCOUNTER — Other Ambulatory Visit: Payer: Self-pay

## 2019-02-14 VITALS — BP 138/70 | Ht 64.0 in | Wt 180.0 lb

## 2019-02-14 DIAGNOSIS — M25561 Pain in right knee: Secondary | ICD-10-CM | POA: Diagnosis not present

## 2019-02-14 DIAGNOSIS — M25562 Pain in left knee: Secondary | ICD-10-CM

## 2019-02-14 MED ORDER — MELOXICAM 15 MG PO TABS: 15.0000 mg | ORAL_TABLET | Freq: Every day | ORAL | 2 refills | Status: DC

## 2019-02-14 MED ORDER — HYDROCODONE-ACETAMINOPHEN 5-325 MG PO TABS: 1.0000 | ORAL_TABLET | Freq: Four times a day (QID) | ORAL | 0 refills | Status: DC | PRN

## 2019-02-14 NOTE — Patient Instructions (Signed)
Get the MRI of your knee - you may want to call your insurance company to make sure the approval is still valid - if it's not call us and we will go through the authorization process again. These are the different medications you can take for this: Capsaicin, aspercreme, or biofreeze topically up to four times a day may also help with pain. Some supplements that may help for arthritis: Boswellia extract, curcumin, pycnogenol Meloxicam 15 mg daily with food for pain and inflammation. You can use lidocaine patches also. norco as needed for severe pain - we don't refill this. It's important that you continue to stay active. Straight leg raises, knee extensions 3 sets of 10 once a day (add ankle weight if these become too easy). Follow up with me after the MRI to go over results, next steps.

## 2019-02-15 NOTE — Progress Notes (Signed)
PCP: Vista Deck, NP  Subjective:   HPI: Patient is a 58 y.o. female here for right knee pain.  4/22: Patient returns with bilateral knee pain - 10/10 on right, 6/10 on left with history of arthritis. Starting gelsyn series today. No skin changes.  4/29: Patient returns for second gelsyn injections. Mild improvement since last visit - 5/10 pain bilaterally. No skin changes.  5/6: Patient returns for third gelsyn injections. No skin changes. Pain down to 3/10 on left, 0/10 on right.  5/30: Patient returns with 10/10 pain in both knees anteriorly. We went ahead with 4th gelsyn injections.  10/14: Patient reports she had some improvement following last gelsyn injections though has still had pain especially in right knee. Past week pain really increased in right knee with associated swelling. She did not get the MRI that was ordered after last visit - states it's scheduled for next week. Worse when she's up and walking around at work. No skin changes.  Past Medical History:  Diagnosis Date  . Diabetes (HCC)   . Hypertension   . Neuropathy     Current Outpatient Medications on File Prior to Visit  Medication Sig Dispense Refill  . aspirin EC 81 MG tablet Take by mouth.    Marland Kitchen atorvastatin (LIPITOR) 80 MG tablet Take 80 mg by mouth at bedtime.  1  . busPIRone (BUSPAR) 15 MG tablet Take 15 mg by mouth 3 (three) times daily as needed.    . citalopram (CELEXA) 20 MG tablet Take 20 mg by mouth daily.  0  . gabapentin (NEURONTIN) 300 MG capsule Take 300 mg by mouth 3 (three) times daily.    Marland Kitchen lisinopril-hydrochlorothiazide (PRINZIDE,ZESTORETIC) 20-25 MG tablet Take 1 tablet by mouth daily.  1  . metFORMIN (GLUCOPHAGE-XR) 500 MG 24 hr tablet Take by mouth.    . potassium chloride SA (K-DUR) 20 MEQ tablet Take 1 tablet (20 mEq total) by mouth 2 (two) times daily for 2 days. 4 tablet 0   No current facility-administered medications on file prior to visit.     Past  Surgical History:  Procedure Laterality Date  . CHOLECYSTECTOMY    . LEEP    . SHOULDER SURGERY Right     No Known Allergies  Social History   Socioeconomic History  . Marital status: Married    Spouse name: Not on file  . Number of children: Not on file  . Years of education: Not on file  . Highest education level: Not on file  Occupational History  . Not on file  Social Needs  . Financial resource strain: Not on file  . Food insecurity    Worry: Not on file    Inability: Not on file  . Transportation needs    Medical: Not on file    Non-medical: Not on file  Tobacco Use  . Smoking status: Current Every Day Smoker    Packs/day: 0.50    Types: Cigarettes  . Smokeless tobacco: Never Used  Substance and Sexual Activity  . Alcohol use: No  . Drug use: No  . Sexual activity: Not on file  Lifestyle  . Physical activity    Days per week: Not on file    Minutes per session: Not on file  . Stress: Not on file  Relationships  . Social Musician on phone: Not on file    Gets together: Not on file    Attends religious service: Not on file    Active member  of club or organization: Not on file    Attends meetings of clubs or organizations: Not on file    Relationship status: Not on file  . Intimate partner violence    Fear of current or ex partner: Not on file    Emotionally abused: Not on file    Physically abused: Not on file    Forced sexual activity: Not on file  Other Topics Concern  . Not on file  Social History Narrative  . Not on file    Family History  Problem Relation Age of Onset  . Pancreatic cancer Mother   . COPD Father     BP 138/70   Ht 5\' 4"  (1.626 m)   Wt 180 lb (81.6 kg)   BMI 30.90 kg/m   Review of Systems: See HPI above.     Objective:  Physical Exam:  Gen: NAD, comfortable in exam room  Right knee: Moderate effusion.  No warmth, redness, gross deformity, ecchymoses. Diffuse TTP worst medial joint line ROM 0 - 100  degrees with 5/5 strength flexion and extension. Negative ant/post drawers. Negative valgus/varus testing. Negative lachmans. Medial pain with mcmurrays.  Negative apleys, patellar apprehension. NV intact distally.  Assessment & Plan:  1. Bilateral knee pain - worst on the right.  Unfortunately has continued to persist despite steroid injection, gelsyn series.  Concerning she may have concurrent meniscus tear vs worse arthritis than what is seen on radiographs.  Will go ahead with MRI.  In meantime, compression, meloxicam, topical medications, norco if needed.  Home exercises.

## 2019-02-26 ENCOUNTER — Emergency Department (HOSPITAL_BASED_OUTPATIENT_CLINIC_OR_DEPARTMENT_OTHER)
Admission: EM | Admit: 2019-02-26 | Discharge: 2019-02-26 | Disposition: A | Discharge status: Home / Self Care | Payer: 59 | Attending: Emergency Medicine | Admitting: Emergency Medicine

## 2019-02-26 ENCOUNTER — Encounter (HOSPITAL_BASED_OUTPATIENT_CLINIC_OR_DEPARTMENT_OTHER): Payer: Self-pay | Admitting: Emergency Medicine

## 2019-02-26 ENCOUNTER — Other Ambulatory Visit: Payer: Self-pay

## 2019-02-26 DIAGNOSIS — I10 Essential (primary) hypertension: Secondary | ICD-10-CM | POA: Insufficient documentation

## 2019-02-26 DIAGNOSIS — E119 Type 2 diabetes mellitus without complications: Secondary | ICD-10-CM | POA: Insufficient documentation

## 2019-02-26 DIAGNOSIS — Z7984 Long term (current) use of oral hypoglycemic drugs: Secondary | ICD-10-CM | POA: Insufficient documentation

## 2019-02-26 DIAGNOSIS — Z7982 Long term (current) use of aspirin: Secondary | ICD-10-CM | POA: Insufficient documentation

## 2019-02-26 DIAGNOSIS — F17210 Nicotine dependence, cigarettes, uncomplicated: Secondary | ICD-10-CM | POA: Diagnosis not present

## 2019-02-26 DIAGNOSIS — K029 Dental caries, unspecified: Secondary | ICD-10-CM | POA: Diagnosis not present

## 2019-02-26 DIAGNOSIS — Z79899 Other long term (current) drug therapy: Secondary | ICD-10-CM | POA: Diagnosis not present

## 2019-02-26 DIAGNOSIS — M549 Dorsalgia, unspecified: Secondary | ICD-10-CM | POA: Diagnosis present

## 2019-02-26 DIAGNOSIS — M5430 Sciatica, unspecified side: Secondary | ICD-10-CM | POA: Diagnosis not present

## 2019-02-26 HISTORY — DX: Other chronic pain: G89.29

## 2019-02-26 MED ORDER — DEXAMETHASONE SODIUM PHOSPHATE 10 MG/ML IJ SOLN
10.0000 mg | Freq: Once | INTRAMUSCULAR | Status: AC
  Administered 2019-02-26: 10 mg via INTRAMUSCULAR
  Filled 2019-02-26: qty 1

## 2019-02-26 MED ORDER — DIAZEPAM 5 MG PO TABS: 5.0000 mg | ORAL_TABLET | Freq: Two times a day (BID) | ORAL | 0 refills | Status: DC

## 2019-02-26 MED ORDER — DIAZEPAM 5 MG PO TABS
5.0000 mg | ORAL_TABLET | Freq: Once | ORAL | Status: AC
  Administered 2019-02-26: 5 mg via ORAL
  Filled 2019-02-26: qty 1

## 2019-02-26 MED ORDER — IBUPROFEN 600 MG PO TABS: 600.0000 mg | ORAL_TABLET | Freq: Four times a day (QID) | ORAL | 0 refills | Status: AC | PRN

## 2019-02-26 MED ORDER — MORPHINE SULFATE (PF) 4 MG/ML IV SOLN
4.0000 mg | Freq: Once | INTRAVENOUS | Status: AC
  Administered 2019-02-26: 12:00:00 4 mg via INTRAMUSCULAR
  Filled 2019-02-26: qty 1

## 2019-02-26 MED ORDER — AMOXICILLIN 500 MG PO CAPS: 500.0000 mg | ORAL_CAPSULE | Freq: Three times a day (TID) | ORAL | 0 refills | Status: DC

## 2019-02-26 MED ORDER — AMOXICILLIN 500 MG PO CAPS
500.0000 mg | ORAL_CAPSULE | Freq: Once | ORAL | Status: AC
  Administered 2019-02-26: 500 mg via ORAL
  Filled 2019-02-26: qty 1

## 2019-02-26 NOTE — ED Triage Notes (Signed)
Pt had MRI one week ago for back pain.  Pt states she needs medication for pain.  Pt states she has some leg weakness.   Pt has some facial swelling as well due to abscess to left upper tooth area.

## 2019-02-26 NOTE — ED Provider Notes (Signed)
[facility] EMERGENCY DEPARTMENT Provider Note   CSN: [id number] Arrival date & time: 02/26/19  1034     History   Chief Complaint Chief Complaint  Patient presents with  . Back Pain    HPI Michelle Hess is a 58 y.o. female.     Pt presents to the ED today with low back pain and pain going down the right leg.  The pt has a hx of chronic back pain and takes neurontin for her pain.  That is not helping.  Pt received a rx for lortab on 10/14 for pain.  Pt said she's not been able to sleep.  She also c/o swelling to her left face from a cavity.     Past Medical History:  Diagnosis Date  . Chronic back pain greater than 3 months duration   . Diabetes ([place])   . Hypertension   . Neuropathy     Patient Active Problem List   Diagnosis Date Noted  . Depression with anxiety 11/07/2017  . Essential hypertension 11/07/2017  . Mixed hyperlipidemia 11/07/2017  . Primary insomnia 11/07/2017  . Tobacco dependence 11/07/2017  . Uncontrolled type 2 diabetes mellitus with peripheral neuropathy ([place]) 11/07/2017  . Vitamin D deficiency 11/07/2017  . Left knee injury, subsequent encounter 08/12/2017  . Upper respiratory tract infection     Past Surgical History:  Procedure Laterality Date  . CHOLECYSTECTOMY    . LEEP    . SHOULDER SURGERY Right      OB History   No obstetric history on file.      Home Medications    Prior to Admission medications   Medication Sig Start Date End Date Taking? Authorizing Provider  amoxicillin (AMOXIL) 500 MG capsule Take 1 capsule (500 mg total) by mouth 3 (three) times daily. 02/26/19   Isla Pence, MD  aspirin EC 81 MG tablet Take by mouth. 01/22/19 01/22/20  [provider]  atorvastatin (LIPITOR) 80 MG tablet Take 80 mg by mouth at bedtime. 11/11/17   [provider]  busPIRone (BUSPAR) 15 MG tablet Take 15 mg by mouth 3 (three) times daily as needed. 09/14/18   [provider]  citalopram (CELEXA)  20 MG tablet Take 20 mg by mouth daily. 11/11/17   [provider]  diazepam (VALIUM) 5 MG tablet Take 1 tablet (5 mg total) by mouth 2 (two) times daily. 02/26/19   Isla Pence, MD  gabapentin (NEURONTIN) 300 MG capsule Take 300 mg by mouth 3 (three) times daily.    [provider]  HYDROcodone-acetaminophen (NORCO) 5-325 MG tablet Take 1 tablet by mouth every 6 (six) hours as needed for moderate pain. 02/14/19   Hudnall, Sharyn Lull, MD  ibuprofen (ADVIL) 600 MG tablet Take 1 tablet (600 mg total) by mouth every 6 (six) hours as needed. 02/26/19   Isla Pence, MD  lisinopril-hydrochlorothiazide (PRINZIDE,ZESTORETIC) 20-25 MG tablet Take 1 tablet by mouth daily. 10/29/17   [provider]  meloxicam (MOBIC) 15 MG tablet Take 1 tablet (15 mg total) by mouth daily. 02/14/19   Hudnall, Sharyn Lull, MD  metFORMIN (GLUCOPHAGE-XR) 500 MG 24 hr tablet Take by mouth. 11/11/17   [provider]  potassium chloride SA (K-DUR) 20 MEQ tablet Take 1 tablet (20 mEq total) by mouth 2 (two) times daily for 2 days. 10/11/18 10/13/18  Gareth Morgan, MD    Family History Family History  Problem Relation Age of Onset  . Pancreatic cancer Mother   . COPD Father  Social History Social History   Tobacco Use  . Smoking status: Current Every Day Smoker    Packs/day: 0.50    Types: Cigarettes  . Smokeless tobacco: Never Used  Substance Use Topics  . Alcohol use: No  . Drug use: No     Allergies   Patient has no known allergies.   Review of Systems Review of Systems  HENT: Positive for dental problem and facial swelling.   Musculoskeletal: Positive for back pain.  Neurological: Positive for numbness.  All other systems reviewed and are negative.    Physical Exam Updated Vital Signs BP 134/79 (BP Location: Right Arm)   Pulse 76   Temp 98.5 F (36.9 C) (Oral)   Resp 18   Ht 5\' 4"  (1.626 m)   Wt 67.1 kg   SpO2 100%   BMI 25.40 kg/m   Physical Exam  Vitals signs and nursing note reviewed.  Constitutional:      Appearance: Normal appearance.  HENT:     Right Ear: External ear normal.     Left Ear: External ear normal.     Nose: Nose normal.     Mouth/Throat:     Mouth: Mucous membranes are moist.     Dentition: Dental caries present.     Pharynx: Oropharynx is clear.     Comments: Swelling to left face Eyes:     Extraocular Movements: Extraocular movements intact.     Conjunctiva/sclera: Conjunctivae normal.     Pupils: Pupils are equal, round, and reactive to light.  Cardiovascular:     Rate and Rhythm: Normal rate and regular rhythm.     Pulses: Normal pulses.     Heart sounds: Normal heart sounds.  Pulmonary:     Effort: Pulmonary effort is normal.     Breath sounds: Normal breath sounds.  Abdominal:     General: Abdomen is flat. Bowel sounds are normal.     Palpations: Abdomen is soft.  Musculoskeletal: Normal range of motion.  Skin:    General: Skin is warm.     Capillary Refill: Capillary refill takes less than 2 seconds.  Neurological:     General: No focal deficit present.     Mental Status: She is alert and oriented to person, place, and time.  Psychiatric:        Mood and Affect: Mood normal.        Behavior: Behavior normal.        Thought Content: Thought content normal.        Judgment: Judgment normal.      ED Treatments / Results  Labs (all labs ordered are listed, but only abnormal results are displayed) Labs Reviewed - No data to display  EKG None  Radiology No results found.  Procedures Procedures (including critical care time)  Medications Ordered in ED Medications  dexamethasone (DECADRON) injection 10 mg (has no administration in time range)  morphine 4 MG/ML injection 4 mg (has no administration in time range)  diazepam (VALIUM) tablet 5 mg (has no administration in time range)  amoxicillin (AMOXIL) capsule 500 mg (has no administration in time range)     Initial Impression /  Assessment and Plan / ED Course  I have reviewed the triage vital signs and the nursing notes.  Pertinent labs & imaging results that were available during my care of the patient were reviewed by me and considered in my medical decision making (see chart for details).       Pt will be  given a dose of decadron while here.  She is diabetic and is told to keep a close eye on her bs.  She will also be given a rx for valium/ibuprofen for the pain.  She is given a rx for amox for her teeth and is told to f/u with the dentist.  Return if worse.  Final Clinical Impressions(s) / ED Diagnoses   Final diagnoses:  Acute sciatica  Dental caries    ED Discharge Orders         Ordered    diazepam (VALIUM) 5 MG tablet  2 times daily     02/26/19 1145    ibuprofen (ADVIL) 600 MG tablet  Every 6 hours PRN     02/26/19 1145    amoxicillin (AMOXIL) 500 MG capsule  3 times daily     02/26/19 1150           Jacalyn LefevreHaviland, [place] Vandergriff, MD 02/26/19 1152

## 2019-02-26 NOTE — ED Notes (Signed)
ED Provider at bedside. 

## 2019-03-05 ENCOUNTER — Encounter (HOSPITAL_BASED_OUTPATIENT_CLINIC_OR_DEPARTMENT_OTHER): Payer: Self-pay | Admitting: *Deleted

## 2019-03-05 ENCOUNTER — Other Ambulatory Visit: Payer: Self-pay

## 2019-03-05 ENCOUNTER — Emergency Department (HOSPITAL_BASED_OUTPATIENT_CLINIC_OR_DEPARTMENT_OTHER)
Admission: EM | Admit: 2019-03-05 | Discharge: 2019-03-05 | Disposition: A | Discharge status: Home / Self Care | Payer: 59 | Attending: Emergency Medicine | Admitting: Emergency Medicine

## 2019-03-05 DIAGNOSIS — E1165 Type 2 diabetes mellitus with hyperglycemia: Secondary | ICD-10-CM | POA: Insufficient documentation

## 2019-03-05 DIAGNOSIS — I10 Essential (primary) hypertension: Secondary | ICD-10-CM | POA: Diagnosis not present

## 2019-03-05 DIAGNOSIS — R739 Hyperglycemia, unspecified: Secondary | ICD-10-CM

## 2019-03-05 DIAGNOSIS — F17210 Nicotine dependence, cigarettes, uncomplicated: Secondary | ICD-10-CM | POA: Insufficient documentation

## 2019-03-05 DIAGNOSIS — Z7982 Long term (current) use of aspirin: Secondary | ICD-10-CM | POA: Insufficient documentation

## 2019-03-05 DIAGNOSIS — Z79899 Other long term (current) drug therapy: Secondary | ICD-10-CM | POA: Diagnosis not present

## 2019-03-05 DIAGNOSIS — E785 Hyperlipidemia, unspecified: Secondary | ICD-10-CM | POA: Insufficient documentation

## 2019-03-05 DIAGNOSIS — Z7984 Long term (current) use of oral hypoglycemic drugs: Secondary | ICD-10-CM | POA: Diagnosis not present

## 2019-03-05 LAB — POCT I-STAT EG7
Acid-Base Excess: 3 mmol/L — ABNORMAL HIGH (ref 0.0–2.0)
Bicarbonate: 29.7 mmol/L — ABNORMAL HIGH (ref 20.0–28.0)
Calcium, Ion: 1.29 mmol/L (ref 1.15–1.40)
HCT: 48 % — ABNORMAL HIGH (ref 36.0–46.0)
Hemoglobin: 16.3 g/dL — ABNORMAL HIGH (ref 12.0–15.0)
O2 Saturation: 66 %
Patient temperature: 99.2
Potassium: 3.7 mmol/L (ref 3.5–5.1)
Sodium: 138 mmol/L (ref 135–145)
TCO2: 31 mmol/L (ref 22–32)
pCO2, Ven: 51.7 mmHg (ref 44.0–60.0)
pH, Ven: 7.369 (ref 7.250–7.430)
pO2, Ven: 37 mmHg (ref 32.0–45.0)

## 2019-03-05 LAB — COMPREHENSIVE METABOLIC PANEL
ALT: 27 U/L (ref 0–44)
AST: 14 U/L — ABNORMAL LOW (ref 15–41)
Albumin: 4.3 g/dL (ref 3.5–5.0)
Alkaline Phosphatase: 124 U/L (ref 38–126)
Anion gap: 12 (ref 5–15)
BUN: 28 mg/dL — ABNORMAL HIGH (ref 6–20)
CO2: 26 mmol/L (ref 22–32)
Calcium: 10.2 mg/dL (ref 8.9–10.3)
Chloride: 99 mmol/L (ref 98–111)
Creatinine, Ser: 1.22 mg/dL — ABNORMAL HIGH (ref 0.44–1.00)
GFR calc Af Amer: 57 mL/min — ABNORMAL LOW (ref >60)
GFR calc non Af Amer: 49 mL/min — ABNORMAL LOW (ref >60)
Glucose, Bld: 512 mg/dL (ref 70–99)
Potassium: 3.7 mmol/L (ref 3.5–5.1)
Sodium: 137 mmol/L (ref 135–145)
Total Bilirubin: 0.8 mg/dL (ref 0.3–1.2)
Total Protein: 7.8 g/dL (ref 6.5–8.1)

## 2019-03-05 LAB — CBC WITH DIFFERENTIAL/PLATELET
Abs Immature Granulocytes: 0.02 10*3/uL (ref 0.00–0.07)
Basophils Absolute: 0 10*3/uL (ref 0.0–0.1)
Basophils Relative: 0 %
Eosinophils Absolute: 0.1 10*3/uL (ref 0.0–0.5)
Eosinophils Relative: 1 %
HCT: 46.5 % — ABNORMAL HIGH (ref 36.0–46.0)
Hemoglobin: 15.4 g/dL — ABNORMAL HIGH (ref 12.0–15.0)
Immature Granulocytes: 0 %
Lymphocytes Relative: 41 %
Lymphs Abs: 3.7 10*3/uL (ref 0.7–4.0)
MCH: 31.1 pg (ref 26.0–34.0)
MCHC: 33.1 g/dL (ref 30.0–36.0)
MCV: 93.9 fL (ref 80.0–100.0)
Monocytes Absolute: 0.4 10*3/uL (ref 0.1–1.0)
Monocytes Relative: 4 %
Neutro Abs: 4.8 10*3/uL (ref 1.7–7.7)
Neutrophils Relative %: 54 %
Platelets: 229 10*3/uL (ref 150–400)
RBC: 4.95 MIL/uL (ref 3.87–5.11)
RDW: 13.2 % (ref 11.5–15.5)
WBC: 9 10*3/uL (ref 4.0–10.5)
nRBC: 0 % (ref 0.0–0.2)

## 2019-03-05 LAB — URINALYSIS, ROUTINE W REFLEX MICROSCOPIC
Bilirubin Urine: NEGATIVE
Glucose, UA: >=500 mg/dL — AB
Ketones, ur: NEGATIVE mg/dL
Nitrite: NEGATIVE
Protein, ur: NEGATIVE mg/dL
Specific Gravity, Urine: 1.02 (ref 1.005–1.030)
pH: 5.5 (ref 5.0–8.0)

## 2019-03-05 LAB — URINALYSIS, MICROSCOPIC (REFLEX)

## 2019-03-05 LAB — CBG MONITORING, ED
Glucose-Capillary: 462 mg/dL — ABNORMAL HIGH (ref 70–99)
Glucose-Capillary: 422 mg/dL — ABNORMAL HIGH (ref 70–99)
Glucose-Capillary: 326 mg/dL — ABNORMAL HIGH (ref 70–99)

## 2019-03-05 MED ORDER — FLUCONAZOLE 150 MG PO TABS: 150.0000 mg | ORAL_TABLET | Freq: Every day | ORAL | 0 refills | Status: AC

## 2019-03-05 MED ORDER — INSULIN REGULAR HUMAN 100 UNIT/ML IJ SOLN
6.0000 [IU] | Freq: Once | INTRAMUSCULAR | Status: AC
  Administered 2019-03-05: 12:00:00 6 [IU] via SUBCUTANEOUS
  Filled 2019-03-05: qty 1

## 2019-03-05 MED ORDER — FLUCONAZOLE 150 MG PO TABS
150.0000 mg | ORAL_TABLET | Freq: Once | ORAL | Status: AC
  Administered 2019-03-05: 11:00:00 150 mg via ORAL
  Filled 2019-03-05: qty 1

## 2019-03-05 MED ORDER — SODIUM CHLORIDE 0.9 % IV BOLUS
1000.0000 mL | Freq: Once | INTRAVENOUS | Status: AC
  Administered 2019-03-05: 09:00:00 1000 mL via INTRAVENOUS

## 2019-03-05 MED ORDER — CEPHALEXIN 500 MG PO CAPS: 500.0000 mg | ORAL_CAPSULE | Freq: Two times a day (BID) | ORAL | 0 refills | Status: AC

## 2019-03-05 MED ORDER — ONDANSETRON HCL 4 MG/2ML IJ SOLN
4.0000 mg | Freq: Once | INTRAMUSCULAR | Status: AC
  Administered 2019-03-05: 09:00:00 4 mg via INTRAVENOUS
  Filled 2019-03-05: qty 2

## 2019-03-05 MED ORDER — ONDANSETRON HCL 4 MG PO TABS: 4.0000 mg | ORAL_TABLET | Freq: Four times a day (QID) | ORAL | 0 refills | Status: DC

## 2019-03-05 MED ORDER — SODIUM CHLORIDE 0.9 % IV BOLUS
1000.0000 mL | Freq: Once | INTRAVENOUS | Status: AC
  Administered 2019-03-05: 11:00:00 1000 mL via INTRAVENOUS

## 2019-03-05 MED ORDER — INSULIN REGULAR HUMAN 100 UNIT/ML IJ SOLN: 8.0000 [IU] | Freq: Once | INTRAMUSCULAR | Status: DC

## 2019-03-05 NOTE — ED Provider Notes (Signed)
[facility] EMERGENCY DEPARTMENT Provider Note   CSN: [id number] Arrival date & time: 03/05/19  0850     History   Chief Complaint Chief Complaint  Patient presents with   Hyperglycemia    HPI Michelle Hess is a 58 y.o. female with history of hypertension, diabetes, chronic back pain, neuropathy, depression who presents with hyperglycemia.  Patient reports her blood sugar was undetectable yesterday.  She has had nausea, but no vomiting.  She denies any fevers, chest pain, shortness of breath, abdominal pain, vomiting.  Patient has chronic back pain, which is unchanged.  Patient takes Metformin and basal insulin.     HPI  Past Medical History:  Diagnosis Date   Chronic back pain greater than 3 months duration    Diabetes ([facility])    Hypertension    Neuropathy     Patient Active Problem List   Diagnosis Date Noted   Depression with anxiety 11/07/2017   Essential hypertension 11/07/2017   Mixed hyperlipidemia 11/07/2017   Primary insomnia 11/07/2017   Tobacco dependence 11/07/2017   Uncontrolled type 2 diabetes mellitus with peripheral neuropathy ([facility]) 11/07/2017   Vitamin D deficiency 11/07/2017   Left knee injury, subsequent encounter 08/12/2017   Upper respiratory tract infection     Past Surgical History:  Procedure Laterality Date   CHOLECYSTECTOMY     LEEP     SHOULDER SURGERY Right      OB History   No obstetric history on file.      Home Medications    Prior to Admission medications   Medication Sig Start Date End Date Taking? Authorizing Provider  amoxicillin (AMOXIL) 500 MG capsule Take 1 capsule (500 mg total) by mouth 3 (three) times daily. 02/26/19   Jacalyn LefevreHaviland, Julie, MD  aspirin EC 81 MG tablet Take by mouth. 01/22/19 01/22/20  [provider]  atorvastatin (LIPITOR) 80 MG tablet Take 80 mg by mouth at bedtime. 11/11/17   [provider]  busPIRone (BUSPAR) 15 MG tablet Take 15 mg by mouth 3 (three) times  daily as needed. 09/14/18   [provider]  cephALEXin (KEFLEX) 500 MG capsule Take 1 capsule (500 mg total) by mouth 2 (two) times daily for 7 days. 03/05/19 03/12/19  Shamica Moree, Waylan BogaAlexandra M, PA-C  citalopram (CELEXA) 20 MG tablet Take 20 mg by mouth daily. 11/11/17   [provider]  diazepam (VALIUM) 5 MG tablet Take 1 tablet (5 mg total) by mouth 2 (two) times daily. 02/26/19   Jacalyn LefevreHaviland, Julie, MD  fluconazole (DIFLUCAN) 150 MG tablet Take 1 tablet (150 mg total) by mouth daily for 1 dose. 03/08/19 03/09/19  Emi HolesLaw, Meriam Chojnowski M, PA-C  gabapentin (NEURONTIN) 300 MG capsule Take 300 mg by mouth 3 (three) times daily.    [provider]  HYDROcodone-acetaminophen (NORCO) 5-325 MG tablet Take 1 tablet by mouth every 6 (six) hours as needed for moderate pain. 02/14/19   Hudnall, Azucena FallenShane R, MD  ibuprofen (ADVIL) 600 MG tablet Take 1 tablet (600 mg total) by mouth every 6 (six) hours as needed. 02/26/19   Jacalyn LefevreHaviland, Julie, MD  lisinopril-hydrochlorothiazide (PRINZIDE,ZESTORETIC) 20-25 MG tablet Take 1 tablet by mouth daily. 10/29/17   [provider]  meloxicam (MOBIC) 15 MG tablet Take 1 tablet (15 mg total) by mouth daily. 02/14/19   Hudnall, Azucena FallenShane R, MD  metFORMIN (GLUCOPHAGE-XR) 500 MG 24 hr tablet Take by mouth. 11/11/17   [provider]  ondansetron (ZOFRAN) 4 MG tablet Take 1 tablet (4 mg total) by mouth  every 6 (six) hours. 03/05/19   Reece Mcbroom, Bea Graff, PA-C  potassium chloride SA (K-DUR) 20 MEQ tablet Take 1 tablet (20 mEq total) by mouth 2 (two) times daily for 2 days. 10/11/18 10/13/18  Gareth Morgan, MD    Family History Family History  Problem Relation Age of Onset   Pancreatic cancer Mother    COPD Father     Social History Social History   Tobacco Use   Smoking status: Current Every Day Smoker    Packs/day: 0.50    Types: Cigarettes   Smokeless tobacco: Never Used  Substance Use Topics   Alcohol use: No   Drug use: No     Allergies     Patient has no known allergies.   Review of Systems Review of Systems  Constitutional: Negative for chills and fever.  HENT: Negative for facial swelling and sore throat.   Respiratory: Negative for shortness of breath.   Cardiovascular: Negative for chest pain.  Gastrointestinal: Positive for nausea. Negative for abdominal pain and vomiting.  Genitourinary: Negative for dysuria.  Musculoskeletal: Positive for back pain (chronic).  Skin: Negative for rash and wound.  Neurological: Negative for headaches.  Psychiatric/Behavioral: The patient is not nervous/anxious.      Physical Exam Updated Vital Signs BP 127/87    Pulse 75    Temp 99.3 F (37.4 C) (Oral)    Resp 18    Ht 5\' 4"  (1.626 m)    Wt 84.4 kg    SpO2 99%    BMI 31.93 kg/m   Physical Exam Vitals signs and nursing note reviewed.  Constitutional:      General: She is not in acute distress.    Appearance: She is well-developed. She is obese. She is not diaphoretic.  HENT:     Head: Normocephalic and atraumatic.     Mouth/Throat:     Pharynx: No oropharyngeal exudate.  Eyes:     General: No scleral icterus.       Right eye: No discharge.        Left eye: No discharge.     Conjunctiva/sclera: Conjunctivae normal.     Pupils: Pupils are equal, round, and reactive to light.  Neck:     Musculoskeletal: Normal range of motion and neck supple.     Thyroid: No thyromegaly.  Cardiovascular:     Rate and Rhythm: Normal rate and regular rhythm.     Heart sounds: Normal heart sounds. No murmur. No friction rub. No gallop.   Pulmonary:     Effort: Pulmonary effort is normal. No respiratory distress.     Breath sounds: Normal breath sounds. No stridor. No wheezing or rales.  Abdominal:     General: Bowel sounds are normal. There is no distension.     Palpations: Abdomen is soft.     Tenderness: There is no abdominal tenderness. There is no right CVA tenderness, left CVA tenderness, guarding or rebound.  Lymphadenopathy:      Cervical: No cervical adenopathy.  Skin:    General: Skin is warm and dry.     Coloration: Skin is not pale.     Findings: No rash.  Neurological:     Mental Status: She is alert.     Coordination: Coordination normal.      ED Treatments / Results  Labs (all labs ordered are listed, but only abnormal results are displayed) Labs Reviewed  COMPREHENSIVE METABOLIC PANEL - Abnormal; Notable for the following components:      Result Value  Glucose, Bld 512 (*)    BUN 28 (*)    Creatinine, Ser 1.22 (*)    AST 14 (*)    GFR calc non Af Amer 49 (*)    GFR calc Af Amer 57 (*)    All other components within normal limits  CBC WITH DIFFERENTIAL/PLATELET - Abnormal; Notable for the following components:   Hemoglobin 15.4 (*)    HCT 46.5 (*)    All other components within normal limits  URINALYSIS, ROUTINE W REFLEX MICROSCOPIC - Abnormal; Notable for the following components:   APPearance CLOUDY (*)    Glucose, UA >=500 (*)    Hgb urine dipstick SMALL (*)    Leukocytes,Ua SMALL (*)    All other components within normal limits  URINALYSIS, MICROSCOPIC (REFLEX) - Abnormal; Notable for the following components:   Bacteria, UA MANY (*)    All other components within normal limits  CBG MONITORING, ED - Abnormal; Notable for the following components:   Glucose-Capillary 462 (*)    All other components within normal limits  POCT I-STAT EG7 - Abnormal; Notable for the following components:   Bicarbonate 29.7 (*)    Acid-Base Excess 3.0 (*)    HCT 48.0 (*)    Hemoglobin 16.3 (*)    All other components within normal limits  CBG MONITORING, ED - Abnormal; Notable for the following components:   Glucose-Capillary 422 (*)    All other components within normal limits  CBG MONITORING, ED - Abnormal; Notable for the following components:   Glucose-Capillary 326 (*)    All other components within normal limits  URINE CULTURE  I-STAT VENOUS BLOOD GAS, ED    EKG None  Radiology No  results found.  Procedures Procedures (including critical care time)  Medications Ordered in ED Medications  sodium chloride 0.9 % bolus 1,000 mL (0 mLs Intravenous Stopped 03/05/19 1040)  ondansetron (ZOFRAN) injection 4 mg (4 mg Intravenous Given 03/05/19 0925)  fluconazole (DIFLUCAN) tablet 150 mg (150 mg Oral Given 03/05/19 1124)  sodium chloride 0.9 % bolus 1,000 mL (0 mLs Intravenous Stopped 03/05/19 1353)  insulin regular (NOVOLIN R) 100 units/mL injection 6 Units (6 Units Subcutaneous Given 03/05/19 1145)     Initial Impression / Assessment and Plan / ED Course  I have reviewed the triage vital signs and the nursing notes.  Pertinent labs & imaging results that were available during my care of the patient were reviewed by me and considered in my medical decision making (see chart for details).        Patient presenting with hyperglycemia without signs of DKA.  After IV fluids and subcu insulin, patient is having trending down of her blood sugar.  She is found to have UTI.  Will treat with Keflex.  She also has budding yeast in her urine.  Treated with Diflucan.  We will also discharge home with short course of Zofran, as needed.  Urine culture sent.  Patient advised to touch base with her PCP to discuss glycemic control, although patient's blood sugar could be elevated due to the UTI.  Return precautions discussed.  Patient understands and agrees with plan.  Patient vitals stable throughout ED course and discharged in satisfactory condition.  Final Clinical Impressions(s) / ED Diagnoses   Final diagnoses:  Hyperglycemia    ED Discharge Orders         Ordered    fluconazole (DIFLUCAN) 150 MG tablet  Daily     03/05/19 1401    cephALEXin (KEFLEX)  500 MG capsule  2 times daily     03/05/19 1401    ondansetron (ZOFRAN) 4 MG tablet  Every 6 hours     03/05/19 [address], [place] 03/05/19 1648    Maia Plan, MD 03/05/19 1921

## 2019-03-05 NOTE — ED Triage Notes (Signed)
States cbg was 585 last pm,  Feels like drunk and nausea

## 2019-03-05 NOTE — Discharge Instructions (Signed)
Take Keflex until completed for your urinary tract infection.  You will be called if there needs to be a change based on your urine culture.  Take Diflucan in 3 days to complete treatment for yeast infection.  Please return to the emergency department if you develop any worsening symptoms including severe back pain, fever, intractable vomiting, or any other concerning symptoms.  Please touch base with your doctor and make them aware of your elevated blood sugar.  They may want to make changes to your medications.

## 2019-03-06 ENCOUNTER — Ambulatory Visit
Admission: RE | Admit: 2019-03-06 | Discharge: 2019-03-06 | Disposition: A | Discharge status: Home / Self Care | Payer: 59 | Source: Ambulatory Visit | Attending: Family Medicine | Admitting: Family Medicine

## 2019-03-06 DIAGNOSIS — M170 Bilateral primary osteoarthritis of knee: Secondary | ICD-10-CM

## 2019-03-06 LAB — URINE CULTURE

## 2019-03-06 MED FILL — Insulin Regular (Human) Inj 100 Unit/ML: INTRAMUSCULAR | Qty: 0.06 | Status: AC

## 2019-03-07 ENCOUNTER — Other Ambulatory Visit: Payer: Self-pay

## 2019-03-07 ENCOUNTER — Ambulatory Visit (INDEPENDENT_AMBULATORY_CARE_PROVIDER_SITE_OTHER): Payer: 59 | Admitting: Family Medicine

## 2019-03-07 VITALS — BP 124/74 | Ht 64.0 in | Wt 180.0 lb

## 2019-03-07 DIAGNOSIS — M170 Bilateral primary osteoarthritis of knee: Secondary | ICD-10-CM

## 2019-03-07 NOTE — Patient Instructions (Signed)
We will refer you to the surgeon to discuss knee replacement. These are the different medications you can take for this: Tylenol 500mg  1-2 tabs three times a day for pain. Capsaicin, aspercreme, or biofreeze topically up to four times a day may also help with pain. Some supplements that may help for arthritis: Boswellia extract, curcumin, pycnogenol Aleve 1-2 tabs twice a day with food (or the meloxicam if you want [place] to send more of this in) Topical salon pas patches. Continue your gabapentin also.

## 2019-03-08 ENCOUNTER — Encounter: Payer: Self-pay | Admitting: Family Medicine

## 2019-03-08 NOTE — Progress Notes (Signed)
MRI reviewed and discussed with patient.  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  Discussed that given lack of improvement to conservative treatment as much as expected with steroid injections, viscosupplementation, nsaids, PT/HEP would recommend consultation with orthopedic surgeon - will place referral.

## 2019-03-21 ENCOUNTER — Ambulatory Visit: Payer: 59 | Admitting: Orthopaedic Surgery

## 2019-04-07 ENCOUNTER — Encounter (HOSPITAL_BASED_OUTPATIENT_CLINIC_OR_DEPARTMENT_OTHER): Payer: Self-pay

## 2019-04-07 ENCOUNTER — Emergency Department (HOSPITAL_BASED_OUTPATIENT_CLINIC_OR_DEPARTMENT_OTHER)
Admission: EM | Admit: 2019-04-07 | Discharge: 2019-04-07 | Disposition: A | Discharge status: Home / Self Care | Payer: 59 | Attending: Emergency Medicine | Admitting: Emergency Medicine

## 2019-04-07 ENCOUNTER — Emergency Department (HOSPITAL_BASED_OUTPATIENT_CLINIC_OR_DEPARTMENT_OTHER): Payer: 59

## 2019-04-07 ENCOUNTER — Other Ambulatory Visit: Payer: Self-pay

## 2019-04-07 DIAGNOSIS — F17210 Nicotine dependence, cigarettes, uncomplicated: Secondary | ICD-10-CM | POA: Insufficient documentation

## 2019-04-07 DIAGNOSIS — Z794 Long term (current) use of insulin: Secondary | ICD-10-CM | POA: Insufficient documentation

## 2019-04-07 DIAGNOSIS — I10 Essential (primary) hypertension: Secondary | ICD-10-CM | POA: Diagnosis not present

## 2019-04-07 DIAGNOSIS — Z79899 Other long term (current) drug therapy: Secondary | ICD-10-CM | POA: Insufficient documentation

## 2019-04-07 DIAGNOSIS — R739 Hyperglycemia, unspecified: Secondary | ICD-10-CM

## 2019-04-07 DIAGNOSIS — E1165 Type 2 diabetes mellitus with hyperglycemia: Secondary | ICD-10-CM | POA: Diagnosis not present

## 2019-04-07 DIAGNOSIS — B370 Candidal stomatitis: Secondary | ICD-10-CM | POA: Insufficient documentation

## 2019-04-07 DIAGNOSIS — Z20828 Contact with and (suspected) exposure to other viral communicable diseases: Secondary | ICD-10-CM | POA: Diagnosis not present

## 2019-04-07 DIAGNOSIS — Z7982 Long term (current) use of aspirin: Secondary | ICD-10-CM | POA: Diagnosis not present

## 2019-04-07 LAB — URINALYSIS, ROUTINE W REFLEX MICROSCOPIC
Bilirubin Urine: NEGATIVE
Glucose, UA: >=500 mg/dL — AB
Ketones, ur: NEGATIVE mg/dL
Leukocytes,Ua: NEGATIVE
Nitrite: NEGATIVE
Protein, ur: NEGATIVE mg/dL
Specific Gravity, Urine: 1.01 (ref 1.005–1.030)
pH: 5.5 (ref 5.0–8.0)

## 2019-04-07 LAB — CBC WITH DIFFERENTIAL/PLATELET
Abs Immature Granulocytes: 0.01 10*3/uL (ref 0.00–0.07)
Basophils Absolute: 0 10*3/uL (ref 0.0–0.1)
Basophils Relative: 0 %
Eosinophils Absolute: 0.1 10*3/uL (ref 0.0–0.5)
Eosinophils Relative: 1 %
HCT: 43.4 % (ref 36.0–46.0)
Hemoglobin: 14.4 g/dL (ref 12.0–15.0)
Immature Granulocytes: 0 %
Lymphocytes Relative: 45 %
Lymphs Abs: 3.1 10*3/uL (ref 0.7–4.0)
MCH: 30.5 pg (ref 26.0–34.0)
MCHC: 33.2 g/dL (ref 30.0–36.0)
MCV: 91.9 fL (ref 80.0–100.0)
Monocytes Absolute: 0.3 10*3/uL (ref 0.1–1.0)
Monocytes Relative: 5 %
Neutro Abs: 3.4 10*3/uL (ref 1.7–7.7)
Neutrophils Relative %: 49 %
Platelets: 191 10*3/uL (ref 150–400)
RBC: 4.72 MIL/uL (ref 3.87–5.11)
RDW: 12.2 % (ref 11.5–15.5)
WBC: 7 10*3/uL (ref 4.0–10.5)
nRBC: 0 % (ref 0.0–0.2)

## 2019-04-07 LAB — BASIC METABOLIC PANEL
Anion gap: 13 (ref 5–15)
BUN: 23 mg/dL — ABNORMAL HIGH (ref 6–20)
CO2: 28 mmol/L (ref 22–32)
Calcium: 10.2 mg/dL (ref 8.9–10.3)
Chloride: 93 mmol/L — ABNORMAL LOW (ref 98–111)
Creatinine, Ser: 1.21 mg/dL — ABNORMAL HIGH (ref 0.44–1.00)
GFR calc Af Amer: 58 mL/min — ABNORMAL LOW (ref >60)
GFR calc non Af Amer: 50 mL/min — ABNORMAL LOW (ref >60)
Glucose, Bld: 488 mg/dL — ABNORMAL HIGH (ref 70–99)
Potassium: 3.7 mmol/L (ref 3.5–5.1)
Sodium: 134 mmol/L — ABNORMAL LOW (ref 135–145)

## 2019-04-07 LAB — POCT I-STAT EG7
Acid-Base Excess: 5 mmol/L — ABNORMAL HIGH (ref 0.0–2.0)
Bicarbonate: 33.3 mmol/L — ABNORMAL HIGH (ref 20.0–28.0)
Calcium, Ion: 1.34 mmol/L (ref 1.15–1.40)
HCT: 45 % (ref 36.0–46.0)
Hemoglobin: 15.3 g/dL — ABNORMAL HIGH (ref 12.0–15.0)
O2 Saturation: 25 %
Potassium: 3.7 mmol/L (ref 3.5–5.1)
Sodium: 135 mmol/L (ref 135–145)
TCO2: 35 mmol/L — ABNORMAL HIGH (ref 22–32)
pCO2, Ven: 61.1 mmHg — ABNORMAL HIGH (ref 44.0–60.0)
pH, Ven: 7.344 (ref 7.250–7.430)
pO2, Ven: 19 mmHg — CL (ref 32.0–45.0)

## 2019-04-07 LAB — CBG MONITORING, ED
Glucose-Capillary: 487 mg/dL — ABNORMAL HIGH (ref 70–99)
Glucose-Capillary: 265 mg/dL — ABNORMAL HIGH (ref 70–99)

## 2019-04-07 LAB — URINALYSIS, MICROSCOPIC (REFLEX): RBC / HPF: NONE SEEN RBC/hpf (ref 0–5)

## 2019-04-07 LAB — PREGNANCY, URINE: Preg Test, Ur: NEGATIVE

## 2019-04-07 LAB — SARS CORONAVIRUS 2 AG (30 MIN TAT): SARS Coronavirus 2 Ag: NEGATIVE

## 2019-04-07 MED ORDER — POTASSIUM CHLORIDE CRYS ER 20 MEQ PO TBCR
40.0000 meq | EXTENDED_RELEASE_TABLET | Freq: Once | ORAL | Status: AC
  Administered 2019-04-07: 40 meq via ORAL
  Filled 2019-04-07: qty 2

## 2019-04-07 MED ORDER — NYSTATIN 100000 UNIT/ML MT SUSP: 500000.0000 [IU] | Freq: Four times a day (QID) | OROMUCOSAL | 0 refills | Status: AC

## 2019-04-07 MED ORDER — SODIUM CHLORIDE 0.9 % IV BOLUS
1000.0000 mL | Freq: Once | INTRAVENOUS | Status: AC
  Administered 2019-04-07: 1000 mL via INTRAVENOUS

## 2019-04-07 MED ORDER — INSULIN REGULAR HUMAN 100 UNIT/ML IJ SOLN
10.0000 [IU] | Freq: Once | INTRAMUSCULAR | Status: AC
  Administered 2019-04-07: 10 [IU] via INTRAVENOUS
  Filled 2019-04-07: qty 1

## 2019-04-07 NOTE — ED Notes (Signed)
  CBG 265  

## 2019-04-07 NOTE — Discharge Instructions (Addendum)
Your blood sugars were high today, and I suspect this was causing your symptoms.  You to keep yourself very well-hydrated at home with plain water.  You also need to call your doctor's office on Monday to discuss her insulin regimen.  You may need to be on a higher dose of insulin to control your sugars.  As we discussed, your sugars may be high because of your diet choices.  You should try to avoid carbohydrates, sugary drinks of soda, and cookies, which can all drive up your blood sugar is very high.  Take the time to read through all the information that I have included.  There is important information here about how to monitor your blood sugars.

## 2019-04-07 NOTE — ED Triage Notes (Addendum)
Pt states was at work this morning, felt dizzy, took blood sugar fingerstick, read "hi".  Pt states took her insulin as usual.  Also complains of dry mouth.  pmd increased insulin dose 3 weeks ago.

## 2019-04-07 NOTE — ED Provider Notes (Signed)
[facility] EMERGENCY DEPARTMENT Provider Note   CSN: [id number] Arrival date & time: 04/07/19  0801     History   Chief Complaint Chief Complaint  Patient presents with  . Dizziness    HPI Michelle Hess is a 58 y.o. female with a history of type 2 diabetes, hypertension, chronic back pain, presenting to the ED with high blood sugars and feeling dizzy.  Patient reports it feels like her blood sugars are running high.  She is complaining of a dry mouth and urinary frequency.  She says she also feels dizzy and lightheaded at times.  She states she is on insulin, and takes only Lantus once a day 20 units in the morning.  She says she has been compliant with the medication.  She denies any vomiting or diarrhea.  She reports nausea.  She denies headache, fevers, chills, photophobia.  She states she was recently treated for UTI and completed the full course of antibiotics.  NKDA    HPI  Past Medical History:  Diagnosis Date  . Chronic back pain greater than 3 months duration   . Diabetes (HCC)   . Hypertension   . Neuropathy     Patient Active Problem List   Diagnosis Date Noted  . Depression with anxiety 11/07/2017  . Essential hypertension 11/07/2017  . Mixed hyperlipidemia 11/07/2017  . Primary insomnia 11/07/2017  . Tobacco dependence 11/07/2017  . Uncontrolled type 2 diabetes mellitus with peripheral neuropathy (HCC) 11/07/2017  . Vitamin D deficiency 11/07/2017  . Left knee injury, subsequent encounter 08/12/2017  . Upper respiratory tract infection     Past Surgical History:  Procedure Laterality Date  . CHOLECYSTECTOMY    . LEEP    . SHOULDER SURGERY Right      OB History   No obstetric history on file.      Home Medications    Prior to Admission medications   Medication Sig Start Date End Date Taking? Authorizing Provider  amoxicillin (AMOXIL) 500 MG capsule Take 1 capsule (500 mg total) by mouth 3 (three) times daily. 02/26/19    Jacalyn LefevreHaviland, Julie, MD  aspirin EC 81 MG tablet Take by mouth. 01/22/19 01/22/20  [provider]  atorvastatin (LIPITOR) 80 MG tablet Take 80 mg by mouth at bedtime. 11/11/17   [provider]  busPIRone (BUSPAR) 15 MG tablet Take 15 mg by mouth 3 (three) times daily as needed. 09/14/18   [provider]  citalopram (CELEXA) 20 MG tablet Take 20 mg by mouth daily. 11/11/17   [provider]  diazepam (VALIUM) 5 MG tablet Take 1 tablet (5 mg total) by mouth 2 (two) times daily. 02/26/19   Jacalyn LefevreHaviland, Julie, MD  gabapentin (NEURONTIN) 300 MG capsule Take 300 mg by mouth 3 (three) times daily.    [provider]  HYDROcodone-acetaminophen (NORCO) 5-325 MG tablet Take 1 tablet by mouth every 6 (six) hours as needed for moderate pain. Patient not taking: Reported on 03/07/2019 02/14/19   Lenda KelpHudnall, Shane R, MD  ibuprofen (ADVIL) 600 MG tablet Take 1 tablet (600 mg total) by mouth every 6 (six) hours as needed. 02/26/19   Jacalyn LefevreHaviland, Julie, MD  LANTUS SOLOSTAR 100 UNIT/ML Solostar Pen Inject 20 Units into the skin at bedtime. 03/16/19   [provider]  lisinopril-hydrochlorothiazide (PRINZIDE,ZESTORETIC) 20-25 MG tablet Take 1 tablet by mouth daily. 10/29/17   [provider]  meloxicam (MOBIC) 15 MG tablet Take 1 tablet (15 mg total) by mouth daily. 02/14/19   Hudnall,  Azucena Fallen, MD  metFORMIN (GLUCOPHAGE-XR) 500 MG 24 hr tablet Take by mouth. 11/11/17   [provider]  nystatin (MYCOSTATIN) 100000 UNIT/ML suspension Take 5 mLs (500,000 Units total) by mouth 4 (four) times daily for 7 days. 04/07/19 04/14/19  Terald Sleeper, MD  ondansetron (ZOFRAN) 4 MG tablet Take 1 tablet (4 mg total) by mouth every 6 (six) hours. 03/05/19   Law, Waylan Boga, PA-C  potassium chloride SA (K-DUR) 20 MEQ tablet Take 1 tablet (20 mEq total) by mouth 2 (two) times daily for 2 days. 10/11/18 10/13/18  Alvira Monday, MD    Family History Family History  Problem  Relation Age of Onset  . Pancreatic cancer Mother   . COPD Father     Social History Social History   Tobacco Use  . Smoking status: Current Every Day Smoker    Packs/day: 0.50    Types: Cigarettes  . Smokeless tobacco: Never Used  Substance Use Topics  . Alcohol use: No  . Drug use: No     Allergies   Patient has no known allergies.   Review of Systems Review of Systems  Constitutional: Positive for fatigue. Negative for chills and fever.  HENT: Negative for congestion and sore throat.   Respiratory: Negative for cough and shortness of breath.   Cardiovascular: Negative for chest pain and palpitations.  Gastrointestinal: Positive for nausea. Negative for abdominal pain, diarrhea and vomiting.  Genitourinary: Positive for frequency. Negative for difficulty urinating and dysuria.  Musculoskeletal: Positive for back pain.  Skin: Negative for pallor and rash.  Neurological: Positive for dizziness and light-headedness. Negative for syncope and headaches.  Psychiatric/Behavioral: Negative for agitation and confusion.  All other systems reviewed and are negative.    Physical Exam Updated Vital Signs BP 125/81   Pulse 78   Temp 98.1 F (36.7 C) (Oral)   Resp 16   Ht 5\' 4"  (1.626 m)   Wt 81.6 kg   SpO2 100%   BMI 30.90 kg/m   Physical Exam Vitals signs and nursing note reviewed.  Constitutional:      General: She is not in acute distress.    Appearance: She is well-developed.     Comments: Appears tired  HENT:     Head: Normocephalic and atraumatic.     Mouth/Throat:     Comments: Tachy mucous membranes Eyes:     Conjunctiva/sclera: Conjunctivae normal.  Neck:     Musculoskeletal: Neck supple.  Cardiovascular:     Rate and Rhythm: Normal rate and regular rhythm.     Pulses: Normal pulses.  Pulmonary:     Effort: Pulmonary effort is normal. No respiratory distress.     Breath sounds: Normal breath sounds.  Abdominal:     General: There is no distension.      Palpations: Abdomen is soft.     Tenderness: There is no abdominal tenderness.  Skin:    General: Skin is warm and dry.  Neurological:     General: No focal deficit present.     Mental Status: She is alert and oriented to person, place, and time.      ED Treatments / Results  Labs (all labs ordered are listed, but only abnormal results are displayed) Labs Reviewed  BASIC METABOLIC PANEL - Abnormal; Notable for the following components:      Result Value   Sodium 134 (*)    Chloride 93 (*)    Glucose, Bld 488 (*)    BUN 23 (*)  Creatinine, Ser 1.21 (*)    GFR calc non Af Amer 50 (*)    GFR calc Af Amer 58 (*)    All other components within normal limits  URINALYSIS, ROUTINE W REFLEX MICROSCOPIC - Abnormal; Notable for the following components:   APPearance HAZY (*)    Glucose, UA >=500 (*)    Hgb urine dipstick TRACE (*)    All other components within normal limits  URINALYSIS, MICROSCOPIC (REFLEX) - Abnormal; Notable for the following components:   Bacteria, UA MANY (*)    All other components within normal limits  CBG MONITORING, ED - Abnormal; Notable for the following components:   Glucose-Capillary 487 (*)    All other components within normal limits  CBG MONITORING, ED - Abnormal; Notable for the following components:   Glucose-Capillary 265 (*)    All other components within normal limits  POCT I-STAT EG7 - Abnormal; Notable for the following components:   pCO2, Ven 61.1 (*)    pO2, Ven 19.0 (*)    Bicarbonate 33.3 (*)    TCO2 35 (*)    Acid-Base Excess 5.0 (*)    Hemoglobin 15.3 (*)    All other components within normal limits  SARS CORONAVIRUS 2 AG (30 MIN TAT)  PREGNANCY, URINE  CBC WITH DIFFERENTIAL/PLATELET  BLOOD GAS, VENOUS  CBG MONITORING, ED    EKG None  Radiology Dg Chest Portable 1 View  Result Date: 04/07/2019 CLINICAL DATA:  57 year-old female c/o dizziness, slurred speech since last night. Pt's CBG in triage 487. Pt denies chest  symptoms. Hx of DM, HTN, Smokerinfectious w/u EXAM: PORTABLE CHEST 1 VIEW COMPARISON:  None. FINDINGS: Normal mediastinum and cardiac silhouette. Normal pulmonary vasculature. No evidence of effusion, infiltrate, or pneumothorax. No acute bony abnormality. IMPRESSION: No acute cardiopulmonary process. Electronically Signed   By: Suzy Bouchard M.D.   On: 04/07/2019 10:44    Procedures Procedures (including critical care time)  Medications Ordered in ED Medications  sodium chloride 0.9 % bolus 1,000 mL (0 mLs Intravenous Stopped 04/07/19 1107)  sodium chloride 0.9 % bolus 1,000 mL (0 mLs Intravenous Stopped 04/07/19 1211)  insulin regular (NOVOLIN R) 100 units/mL injection 10 Units (10 Units Intravenous Given 04/07/19 1103)  potassium chloride SA (KLOR-CON) CR tablet 40 mEq (40 mEq Oral Given 04/07/19 1101)     Initial Impression / Assessment and Plan / ED Course  I have reviewed the triage vital signs and the nursing notes.  Pertinent labs & imaging results that were available during my care of the patient were reviewed by me and considered in my medical decision making (see chart for details).  58 yo female here with symptomatic hyperglycemia Reports compliance with home insulin dosing  Recent hx of UTI Will perform infectious w/u including UA, CXR, Covid test (no direct respiratory COVID symptoms but does report malaise and nausea) Labs, IVF Clinical Course as of Apr 06 1516  Sat Apr 07, 2019  1034 No anion gap or ketones in the urine.  Suspect this is simply symptomatic hyperglycemia.  Awaiting Covid test.  We will give a total of 2 L of fluid as well as 10 units of insulin and recheck.   [MT]  1221 Patient feeling better after IV fluids.  Will discharge   [MT]    Clinical Course User Index [MT] Wyvonnia Dusky, MD     Lower suspicion for sepsis or ACS at this time  Michelle Hess was evaluated in Emergency Department on 04/07/2019 for the  symptoms described in the history of  present illness. She was evaluated in the context of the global COVID-19 pandemic, which necessitated consideration that the patient might be at risk for infection with the SARS-CoV-2 virus that causes COVID-19. Institutional protocols and algorithms that pertain to the evaluation of patients at risk for COVID-19 are in a state of rapid change based on information released by regulatory bodies including the CDC and federal and state organizations. These policies and algorithms were followed during the patient's care in the ED.   Final Clinical Impressions(s) / ED Diagnoses   Final diagnoses:  Hyperglycemia  Thrush    ED Discharge Orders         Ordered    nystatin (MYCOSTATIN) 100000 UNIT/ML suspension  4 times daily     04/07/19 1225           Terald Sleeper, MD 04/07/19 [phone]

## 2019-04-07 NOTE — ED Notes (Signed)
CBG in triage 487

## 2019-04-09 MED FILL — Insulin Regular (Human) Inj 100 Unit/ML: INTRAMUSCULAR | Qty: 0.1 | Status: AC

## 2019-04-13 ENCOUNTER — Emergency Department (HOSPITAL_BASED_OUTPATIENT_CLINIC_OR_DEPARTMENT_OTHER)
Admission: EM | Admit: 2019-04-13 | Discharge: 2019-04-13 | Disposition: A | Discharge status: Home / Self Care | Payer: 59 | Attending: Emergency Medicine | Admitting: Emergency Medicine

## 2019-04-13 ENCOUNTER — Encounter (HOSPITAL_BASED_OUTPATIENT_CLINIC_OR_DEPARTMENT_OTHER): Payer: Self-pay | Admitting: *Deleted

## 2019-04-13 ENCOUNTER — Other Ambulatory Visit: Payer: Self-pay

## 2019-04-13 DIAGNOSIS — I10 Essential (primary) hypertension: Secondary | ICD-10-CM | POA: Diagnosis not present

## 2019-04-13 DIAGNOSIS — Z7982 Long term (current) use of aspirin: Secondary | ICD-10-CM | POA: Insufficient documentation

## 2019-04-13 DIAGNOSIS — Z79899 Other long term (current) drug therapy: Secondary | ICD-10-CM | POA: Insufficient documentation

## 2019-04-13 DIAGNOSIS — H1032 Unspecified acute conjunctivitis, left eye: Secondary | ICD-10-CM

## 2019-04-13 DIAGNOSIS — H5712 Ocular pain, left eye: Secondary | ICD-10-CM | POA: Diagnosis present

## 2019-04-13 DIAGNOSIS — F17210 Nicotine dependence, cigarettes, uncomplicated: Secondary | ICD-10-CM | POA: Insufficient documentation

## 2019-04-13 DIAGNOSIS — E119 Type 2 diabetes mellitus without complications: Secondary | ICD-10-CM | POA: Insufficient documentation

## 2019-04-13 DIAGNOSIS — Z7984 Long term (current) use of oral hypoglycemic drugs: Secondary | ICD-10-CM | POA: Insufficient documentation

## 2019-04-13 MED ORDER — ERYTHROMYCIN 5 MG/GM OP OINT: TOPICAL_OINTMENT | OPHTHALMIC | 0 refills | Status: AC

## 2019-04-13 MED ORDER — FLUORESCEIN SODIUM 1 MG OP STRP
1.0000 | ORAL_STRIP | Freq: Once | OPHTHALMIC | Status: AC
  Administered 2019-04-13: 20:00:00 1 via OPHTHALMIC
  Filled 2019-04-13: qty 1

## 2019-04-13 MED ORDER — TETRACAINE HCL 0.5 % OP SOLN
2.0000 [drp] | Freq: Once | OPHTHALMIC | Status: AC
  Administered 2019-04-13: 20:00:00 2 [drp] via OPHTHALMIC
  Filled 2019-04-13: qty 4

## 2019-04-13 NOTE — ED Provider Notes (Signed)
[facility] EMERGENCY DEPARTMENT Provider Note   CSN: [id number] Arrival date & time: 04/13/19  1938     History No chief complaint on file.   Michelle Hess is a 58 y.o. female.  Patient is a 58 year old diabetic with history of hypertension presenting to the emergency department for left eye itching laying for the past 2 days.  Patient denies any injury or trauma.  She does not wear contacts or glasses.  Reports that it started off as feeling painful itchiness in the left eye.  Followed by swelling in the lower lid.  Denies any vision changes, purulent drainage.  Has not tried anything for relief.  She notes that she has had a bump on her lower lid for months or years but it seems to be a little bit more swollen.        Past Medical History:  Diagnosis Date  . Chronic back pain greater than 3 months duration   . Diabetes ([place])   . Hypertension   . Neuropathy     Patient Active Problem List   Diagnosis Date Noted  . Depression with anxiety 11/07/2017  . Essential hypertension 11/07/2017  . Mixed hyperlipidemia 11/07/2017  . Primary insomnia 11/07/2017  . Tobacco dependence 11/07/2017  . Uncontrolled type 2 diabetes mellitus with peripheral neuropathy ([place]) 11/07/2017  . Vitamin D deficiency 11/07/2017  . Left knee injury, subsequent encounter 08/12/2017  . Upper respiratory tract infection     Past Surgical History:  Procedure Laterality Date  . CHOLECYSTECTOMY    . LEEP    . SHOULDER SURGERY Right      OB History   No obstetric history on file.     Family History  Problem Relation Age of Onset  . Pancreatic cancer Mother   . COPD Father     Social History   Tobacco Use  . Smoking status: Current Every Day Smoker    Packs/day: 0.50    Types: Cigarettes  . Smokeless tobacco: Never Used  Substance Use Topics  . Alcohol use: No  . Drug use: No    Home Medications Prior to Admission medications   Medication Sig Start Date End Date  Taking? Authorizing Provider  amoxicillin (AMOXIL) 500 MG capsule Take 1 capsule (500 mg total) by mouth 3 (three) times daily. 02/26/19   Isla Pence, MD  aspirin EC 81 MG tablet Take by mouth. 01/22/19 01/22/20  [provider]  atorvastatin (LIPITOR) 80 MG tablet Take 80 mg by mouth at bedtime. 11/11/17   [provider]  busPIRone (BUSPAR) 15 MG tablet Take 15 mg by mouth 3 (three) times daily as needed. 09/14/18   [provider]  citalopram (CELEXA) 20 MG tablet Take 20 mg by mouth daily. 11/11/17   [provider]  diazepam (VALIUM) 5 MG tablet Take 1 tablet (5 mg total) by mouth 2 (two) times daily. 02/26/19   Isla Pence, MD  erythromycin ophthalmic ointment Place a 1/2 inch ribbon of ointment into the lower eyelid. 04/13/19   Madilyn Hook A, PA-C  gabapentin (NEURONTIN) 300 MG capsule Take 300 mg by mouth 3 (three) times daily.    [provider]  HYDROcodone-acetaminophen (NORCO) 5-325 MG tablet Take 1 tablet by mouth every 6 (six) hours as needed for moderate pain. Patient not taking: Reported on 03/07/2019 02/14/19   Dene Gentry, MD  ibuprofen (ADVIL) 600 MG tablet Take 1 tablet (600 mg total) by mouth every 6 (six) hours as needed. 02/26/19  Jacalyn Lefevre, MD  LANTUS SOLOSTAR 100 UNIT/ML Solostar Pen Inject 20 Units into the skin at bedtime. 03/16/19   [provider]  lisinopril-hydrochlorothiazide (PRINZIDE,ZESTORETIC) 20-25 MG tablet Take 1 tablet by mouth daily. 10/29/17   [provider]  meloxicam (MOBIC) 15 MG tablet Take 1 tablet (15 mg total) by mouth daily. 02/14/19   Hudnall, Azucena Fallen, MD  metFORMIN (GLUCOPHAGE-XR) 500 MG 24 hr tablet Take by mouth. 11/11/17   [provider]  nystatin (MYCOSTATIN) 100000 UNIT/ML suspension Take 5 mLs (500,000 Units total) by mouth 4 (four) times daily for 7 days. 04/07/19 04/14/19  Terald Sleeper, MD  ondansetron (ZOFRAN) 4 MG tablet Take 1 tablet (4 mg total)  by mouth every 6 (six) hours. 03/05/19   Law, Waylan Boga, PA-C  potassium chloride SA (K-DUR) 20 MEQ tablet Take 1 tablet (20 mEq total) by mouth 2 (two) times daily for 2 days. 10/11/18 10/13/18  Alvira Monday, MD    Allergies    Patient has no known allergies.  Review of Systems   Review of Systems  Constitutional: Negative for chills and fever.  HENT: Negative for congestion, ear pain, mouth sores, rhinorrhea, sinus pain and sore throat.   Eyes: Positive for pain, redness and itching. Negative for photophobia, discharge and visual disturbance.  Gastrointestinal: Negative for abdominal pain.  Skin: Negative for rash and wound.  Neurological: Negative for dizziness and headaches.    Physical Exam Updated Vital Signs BP 125/79   Pulse 87   Temp 98.6 F (37 C) (Oral)   Resp 14   Ht 5\' 4"  (1.626 m)   Wt 80.7 kg   SpO2 100%   BMI 30.55 kg/m   Physical Exam Vitals and nursing note reviewed.  Constitutional:      Appearance: Normal appearance.  HENT:     Head: Normocephalic.     Mouth/Throat:     Mouth: Mucous membranes are moist.  Eyes:     General: Lids are everted, no foreign bodies appreciated.     Extraocular Movements:     Right eye: Normal extraocular motion.     Left eye: Normal extraocular motion.     Conjunctiva/sclera:     Left eye: Left conjunctiva is injected. No chemosis, exudate or hemorrhage.    Pupils: Pupils are equal, round, and reactive to light.     Left eye: No corneal abrasion or fluorescein uptake. Seidel exam negative.  Cardiovascular:     Rate and Rhythm: Normal rate and regular rhythm.  Pulmonary:     Effort: Pulmonary effort is normal.  Skin:    General: Skin is dry.  Neurological:     Mental Status: She is alert.  Psychiatric:        Mood and Affect: Mood normal.     ED Results / Procedures / Treatments   Labs (all labs ordered are listed, but only abnormal results are displayed) Labs Reviewed - No data to  display  EKG None  Radiology No results found.  Procedures Procedures (including critical care time)  Medications Ordered in ED Medications  fluorescein ophthalmic strip 1 strip (1 strip Left Eye Given 04/13/19 2023)  tetracaine (PONTOCAINE) 0.5 % ophthalmic solution 2 drop (2 drops Left Eye Given 04/13/19 2023)    ED Course  I have reviewed the triage vital signs and the nursing notes.  Pertinent labs & imaging results that were available during my care of the patient were reviewed by me and considered in my medical decision making (  see chart for details).  Clinical Course as of Apr 12 2034  Fri Apr 13, 2019  2032 Patient with eye irritation and itching since yesterday in the left eye.  On my exam there is no ulcer, corneal abrasion, foreign body.  She does have what appears to be either a sebaceous cyst which is tiny or a external hordeolum of the lower lid.  I am going to give her erythromycin ointment and she was advised on warm compresses.  She will follow up with ophthalmology in 1 week   [KM]    Clinical Course User Index [KM] Jeral PinchMcLean, Zaylin Runco A, PA-C   MDM Rules/Calculators/A&P     CHA2DS2/VAS Stroke Risk Points      N/A >= 2 Points: High Risk  1 - 1.99 Points: Medium Risk  0 Points: Low Risk    A final score could not be computed because of missing components.: Last  Change: N/A     This score determines the patient's risk of having a stroke if the  patient has atrial fibrillation.      This score is not applicable to this patient. Components are not  calculated.                   Based on review of vitals, medical screening exam, lab work and/or imaging, there does not appear to be an acute, emergent etiology for the patient's symptoms. Counseled pt on good return precautions and encouraged both PCP and ED follow-up as needed.  Prior to discharge, I also discussed incidental imaging findings with patient in detail and advised appropriate, recommended follow-up in  detail.  Clinical Impression: 1. Acute bacterial conjunctivitis of left eye     Disposition: Discharge  Prior to providing a prescription for a controlled substance, I independently reviewed the patient's recent prescription history on the [place] Controlled Substance Reporting System. The patient had no recent or regular prescriptions and was deemed appropriate for a brief, less than 3 day prescription of narcotic for acute analgesia.  This note was prepared with assistance of Conservation officer, historic buildingsDragon voice recognition software. Occasional wrong-word or sound-a-like substitutions may have occurred due to the inherent limitations of voice recognition software.  Final Clinical Impression(s) / ED Diagnoses Final diagnoses:  Acute bacterial conjunctivitis of left eye    Rx / DC Orders ED Discharge Orders         Ordered    erythromycin ophthalmic ointment     04/13/19 2034           Arlyn DunningMcLean, Kairee Kozma A, PA-C 04/13/19 2035    Alvira MondaySchlossman, Erin, MD 04/14/19 1537

## 2019-04-13 NOTE — ED Triage Notes (Signed)
Left Eye pain x 3 days, woke up this am with increased swelling. No hx of same. Reports itching and pain.

## 2019-04-13 NOTE — Discharge Instructions (Signed)
You are seen today for left eye irritation.  I think you have a infection in the conjunctiva of your eye.  You also have a small bump on the lower lid.  This may be what is called a sebaceous cyst or could possibly be a stye.  Either way you should apply the ointment that was given.  Apply the ointment to the surface of the eye as well as to the bump.  Apply warm compresses to the eye 2-3 times a day.  Follow-up with ophthalmology in 1 week for further treatment.

## 2019-05-25 ENCOUNTER — Other Ambulatory Visit: Payer: Self-pay | Admitting: Family Medicine

## 2019-10-23 ENCOUNTER — Encounter (HOSPITAL_BASED_OUTPATIENT_CLINIC_OR_DEPARTMENT_OTHER): Payer: Self-pay | Admitting: *Deleted

## 2019-10-23 ENCOUNTER — Emergency Department (HOSPITAL_BASED_OUTPATIENT_CLINIC_OR_DEPARTMENT_OTHER)
Admission: EM | Admit: 2019-10-23 | Discharge: 2019-10-24 | Discharge status: Against Medical Advice | Payer: 59 | Attending: Emergency Medicine | Admitting: Emergency Medicine

## 2019-10-23 ENCOUNTER — Emergency Department (HOSPITAL_BASED_OUTPATIENT_CLINIC_OR_DEPARTMENT_OTHER): Payer: 59

## 2019-10-23 ENCOUNTER — Other Ambulatory Visit: Payer: Self-pay

## 2019-10-23 DIAGNOSIS — F17210 Nicotine dependence, cigarettes, uncomplicated: Secondary | ICD-10-CM | POA: Insufficient documentation

## 2019-10-23 DIAGNOSIS — R61 Generalized hyperhidrosis: Secondary | ICD-10-CM | POA: Diagnosis not present

## 2019-10-23 DIAGNOSIS — Z20822 Contact with and (suspected) exposure to covid-19: Secondary | ICD-10-CM | POA: Diagnosis not present

## 2019-10-23 DIAGNOSIS — E1165 Type 2 diabetes mellitus with hyperglycemia: Secondary | ICD-10-CM | POA: Insufficient documentation

## 2019-10-23 DIAGNOSIS — Z7982 Long term (current) use of aspirin: Secondary | ICD-10-CM | POA: Insufficient documentation

## 2019-10-23 DIAGNOSIS — I10 Essential (primary) hypertension: Secondary | ICD-10-CM | POA: Diagnosis not present

## 2019-10-23 DIAGNOSIS — R739 Hyperglycemia, unspecified: Secondary | ICD-10-CM

## 2019-10-23 DIAGNOSIS — Z7984 Long term (current) use of oral hypoglycemic drugs: Secondary | ICD-10-CM | POA: Insufficient documentation

## 2019-10-23 DIAGNOSIS — R112 Nausea with vomiting, unspecified: Secondary | ICD-10-CM | POA: Diagnosis present

## 2019-10-23 LAB — COMPREHENSIVE METABOLIC PANEL
ALT: 17 U/L (ref 0–44)
AST: 13 U/L — ABNORMAL LOW (ref 15–41)
Albumin: 4 g/dL (ref 3.5–5.0)
Alkaline Phosphatase: 117 U/L (ref 38–126)
Anion gap: 11 (ref 5–15)
BUN: 17 mg/dL (ref 6–20)
CO2: 27 mmol/L (ref 22–32)
Calcium: 9.4 mg/dL (ref 8.9–10.3)
Chloride: 97 mmol/L — ABNORMAL LOW (ref 98–111)
Creatinine, Ser: 1.04 mg/dL — ABNORMAL HIGH (ref 0.44–1.00)
GFR calc Af Amer: >60 mL/min (ref >60)
GFR calc non Af Amer: 59 mL/min — ABNORMAL LOW (ref >60)
Glucose, Bld: 548 mg/dL (ref 70–99)
Potassium: 4.4 mmol/L (ref 3.5–5.1)
Sodium: 135 mmol/L (ref 135–145)
Total Bilirubin: 0.7 mg/dL (ref 0.3–1.2)
Total Protein: 7.6 g/dL (ref 6.5–8.1)

## 2019-10-23 LAB — CBC WITH DIFFERENTIAL/PLATELET
Abs Immature Granulocytes: 0.01 10*3/uL (ref 0.00–0.07)
Basophils Absolute: 0 10*3/uL (ref 0.0–0.1)
Basophils Relative: 0 %
Eosinophils Absolute: 0.1 10*3/uL (ref 0.0–0.5)
Eosinophils Relative: 2 %
HCT: 43.9 % (ref 36.0–46.0)
Hemoglobin: 14.4 g/dL (ref 12.0–15.0)
Immature Granulocytes: 0 %
Lymphocytes Relative: 43 %
Lymphs Abs: 3.1 10*3/uL (ref 0.7–4.0)
MCH: 31 pg (ref 26.0–34.0)
MCHC: 32.8 g/dL (ref 30.0–36.0)
MCV: 94.6 fL (ref 80.0–100.0)
Monocytes Absolute: 0.4 10*3/uL (ref 0.1–1.0)
Monocytes Relative: 6 %
Neutro Abs: 3.5 10*3/uL (ref 1.7–7.7)
Neutrophils Relative %: 49 %
Platelets: 205 10*3/uL (ref 150–400)
RBC: 4.64 MIL/uL (ref 3.87–5.11)
RDW: 12.8 % (ref 11.5–15.5)
WBC: 7.1 10*3/uL (ref 4.0–10.5)
nRBC: 0 % (ref 0.0–0.2)

## 2019-10-23 LAB — URINALYSIS, MICROSCOPIC (REFLEX)

## 2019-10-23 LAB — SARS CORONAVIRUS 2 BY RT PCR (HOSPITAL ORDER, PERFORMED IN ~~LOC~~ HOSPITAL LAB): SARS Coronavirus 2: NEGATIVE

## 2019-10-23 LAB — CBG MONITORING, ED
Glucose-Capillary: 465 mg/dL — ABNORMAL HIGH (ref 70–99)
Glucose-Capillary: 361 mg/dL — ABNORMAL HIGH (ref 70–99)

## 2019-10-23 LAB — URINALYSIS, ROUTINE W REFLEX MICROSCOPIC
Bilirubin Urine: NEGATIVE
Glucose, UA: >=500 mg/dL — AB
Ketones, ur: NEGATIVE mg/dL
Nitrite: NEGATIVE
Protein, ur: NEGATIVE mg/dL
Specific Gravity, Urine: 1.01 (ref 1.005–1.030)
pH: 6 (ref 5.0–8.0)

## 2019-10-23 LAB — PREGNANCY, URINE: Preg Test, Ur: NEGATIVE

## 2019-10-23 LAB — TROPONIN I (HIGH SENSITIVITY): Troponin I (High Sensitivity): 4 ng/L (ref <18)

## 2019-10-23 MED ORDER — SODIUM CHLORIDE 0.9 % IV BOLUS
2000.0000 mL | Freq: Once | INTRAVENOUS | Status: AC
  Administered 2019-10-23: 2000 mL via INTRAVENOUS

## 2019-10-23 MED ORDER — POTASSIUM CHLORIDE CRYS ER 20 MEQ PO TBCR
20.0000 meq | EXTENDED_RELEASE_TABLET | Freq: Once | ORAL | Status: AC
  Administered 2019-10-23: 20 meq via ORAL
  Filled 2019-10-23: qty 1

## 2019-10-23 MED ORDER — INSULIN ASPART 100 UNIT/ML ~~LOC~~ SOLN
6.0000 [IU] | Freq: Once | SUBCUTANEOUS | Status: AC
  Administered 2019-10-23: 6 [IU] via SUBCUTANEOUS
  Filled 2019-10-23: qty 6

## 2019-10-23 MED ORDER — ONDANSETRON HCL 4 MG/2ML IJ SOLN
4.0000 mg | Freq: Once | INTRAMUSCULAR | Status: AC
  Administered 2019-10-23: 4 mg via INTRAVENOUS
  Filled 2019-10-23: qty 2

## 2019-10-23 NOTE — ED Notes (Signed)
ED Provider at bedside. 

## 2019-10-23 NOTE — ED Notes (Signed)
Date and time results received: 10/23/19 2016 (use smartphrase ".now" to insert current time)  Test: glucose Critical Value: 548  Name of Provider Notified: Dr. Juleen China  Orders Received? Or Actions Taken?: awaiting new orders

## 2019-10-23 NOTE — ED Triage Notes (Signed)
C/o n/v x 1 day  

## 2019-10-23 NOTE — ED Provider Notes (Cosign Needed)
[facility] EMERGENCY DEPARTMENT Provider Note   CSN: [id number] Arrival date & time: 10/23/19  1832     History Chief Complaint  Patient presents with   Vomiting    Michelle Hess is a 59 y.o. female.  HPI    Pt is a 59 y/o female with a h/o chronic back pain, diabetes, HTN, neuropathy, who presents to the ED today for eval of nausea and vomiting that started 1 day ago. Denies abd pain, diarrhea, constipation, urinary sxs. States she has subjective fevers, sweats, and a cough for the last few days. Also states she feels like she has an itchy throat. Denies SOB or CP. States her foot tastes funny.   States she is on lantus and has been compliant with this.    Past Medical History:  Diagnosis Date   Chronic back pain greater than 3 months duration    Diabetes (HCC)    Hypertension    Neuropathy     Patient Active Problem List   Diagnosis Date Noted   Depression with anxiety 11/07/2017   Essential hypertension 11/07/2017   Mixed hyperlipidemia 11/07/2017   Primary insomnia 11/07/2017   Tobacco dependence 11/07/2017   Uncontrolled type 2 diabetes mellitus with peripheral neuropathy (HCC) 11/07/2017   Vitamin D deficiency 11/07/2017   Left knee injury, subsequent encounter 08/12/2017   Upper respiratory tract infection     Past Surgical History:  Procedure Laterality Date   CHOLECYSTECTOMY     LEEP     SHOULDER SURGERY Right      OB History   No obstetric history on file.     Family History  Problem Relation Age of Onset   Pancreatic cancer Mother    COPD Father     Social History   Tobacco Use   Smoking status: Current Every Day Smoker    Packs/day: 0.50    Types: Cigarettes   Smokeless tobacco: Never Used  Vaping Use   Vaping Use: Never used  Substance Use Topics   Alcohol use: No   Drug use: No    Home Medications Prior to Admission medications   Medication Sig Start Date End Date Taking? Authorizing  Provider  amoxicillin (AMOXIL) 500 MG capsule Take 1 capsule (500 mg total) by mouth 3 (three) times daily. 02/26/19   Jacalyn Lefevre, MD  aspirin EC 81 MG tablet Take by mouth. 01/22/19 01/22/20  [provider]  atorvastatin (LIPITOR) 80 MG tablet Take 80 mg by mouth at bedtime. 11/11/17   [provider]  busPIRone (BUSPAR) 15 MG tablet Take 15 mg by mouth 3 (three) times daily as needed. 09/14/18   [provider]  citalopram (CELEXA) 20 MG tablet Take 20 mg by mouth daily. 11/11/17   [provider]  diazepam (VALIUM) 5 MG tablet Take 1 tablet (5 mg total) by mouth 2 (two) times daily. 02/26/19   Jacalyn Lefevre, MD  erythromycin ophthalmic ointment Place a 1/2 inch ribbon of ointment into the lower eyelid. 04/13/19   Ronnie Doss A, PA-C  gabapentin (NEURONTIN) 300 MG capsule Take 300 mg by mouth 3 (three) times daily.    [provider]  HYDROcodone-acetaminophen (NORCO) 5-325 MG tablet Take 1 tablet by mouth every 6 (six) hours as needed for moderate pain. Patient not taking: Reported on 03/07/2019 02/14/19   Lenda Kelp, MD  ibuprofen (ADVIL) 600 MG tablet Take 1 tablet (600 mg total) by mouth every 6 (six) hours as needed. 02/26/19   Jacalyn Lefevre, MD  LANTUS SOLOSTAR 100 UNIT/ML Solostar Pen Inject 25 Units into the skin at bedtime.  03/16/19   [provider]  lisinopril-hydrochlorothiazide (PRINZIDE,ZESTORETIC) 20-25 MG tablet Take 1 tablet by mouth daily. 10/29/17   [provider]  meloxicam (MOBIC) 15 MG tablet TAKE 1 TABLET BY MOUTH EVERY DAY 05/25/19   Hudnall, Sharyn Lull, MD  metFORMIN (GLUCOPHAGE-XR) 500 MG 24 hr tablet Take by mouth. 11/11/17   [provider]  metoprolol tartrate (LOPRESSOR) 25 MG tablet SMARTSIG:.5 Tablet(s) By Mouth Twice Daily 04/30/19   [provider]  ondansetron (ZOFRAN) 4 MG tablet Take 1 tablet (4 mg total) by mouth every 6 (six) hours. 03/05/19   Law, Bea Graff, PA-C    potassium chloride SA (K-DUR) 20 MEQ tablet Take 1 tablet (20 mEq total) by mouth 2 (two) times daily for 2 days. 10/11/18 10/13/18  Gareth Morgan, MD    Allergies    Patient has no known allergies.  Review of Systems   Review of Systems  Constitutional: Positive for diaphoresis and fever. Negative for chills.  HENT: Negative for ear pain and sore throat.   Eyes: Negative for pain and visual disturbance.  Respiratory: Positive for cough. Negative for shortness of breath.   Cardiovascular: Negative for chest pain.  Gastrointestinal: Positive for nausea. Negative for abdominal pain, constipation, diarrhea and vomiting.  Genitourinary: Negative for dysuria and hematuria.  Musculoskeletal: Negative for back pain.  Skin: Negative for rash.  Neurological: Negative for syncope and headaches.  All other systems reviewed and are negative.   Physical Exam Updated Vital Signs BP 125/82    Pulse 86    Temp 99 F (37.2 C) (Oral)    Resp 16    Ht 5\' 4"  (1.626 m)    Wt 85.3 kg    SpO2 96%    BMI 32.27 kg/m   Physical Exam Vitals and nursing note reviewed.  Constitutional:      General: She is not in acute distress.    Appearance: She is well-developed.  HENT:     Head: Normocephalic and atraumatic.     Mouth/Throat:     Mouth: Mucous membranes are dry.  Eyes:     Conjunctiva/sclera: Conjunctivae normal.  Cardiovascular:     Rate and Rhythm: Normal rate and regular rhythm.     Heart sounds: Normal heart sounds. No murmur heard.   Pulmonary:     Effort: Pulmonary effort is normal. No respiratory distress.     Breath sounds: Normal breath sounds. No wheezing, rhonchi or rales.     Comments: Dry cough Abdominal:     General: Bowel sounds are normal.     Palpations: Abdomen is soft.     Tenderness: There is no abdominal tenderness. There is no guarding or rebound.  Musculoskeletal:     Cervical back: Neck supple.  Skin:    General: Skin is warm and dry.  Neurological:     Mental  Status: She is alert.     ED Results / Procedures / Treatments   Labs (all labs ordered are listed, but only abnormal results are displayed) Labs Reviewed  COMPREHENSIVE METABOLIC PANEL - Abnormal; Notable for the following components:      Result Value   Chloride 97 (*)    Glucose, Bld 548 (*)    Creatinine, Ser 1.04 (*)    AST 13 (*)    GFR calc non Af Amer 59 (*)    All other components within normal limits  URINALYSIS, ROUTINE W  REFLEX MICROSCOPIC - Abnormal; Notable for the following components:   APPearance HAZY (*)    Glucose, UA >=500 (*)    Hgb urine dipstick TRACE (*)    Leukocytes,Ua TRACE (*)    All other components within normal limits  URINALYSIS, MICROSCOPIC (REFLEX) - Abnormal; Notable for the following components:   Bacteria, UA RARE (*)    All other components within normal limits  CBG MONITORING, ED - Abnormal; Notable for the following components:   Glucose-Capillary 465 (*)    All other components within normal limits  SARS CORONAVIRUS 2 BY RT PCR (HOSPITAL ORDER, PERFORMED IN [facility])  CBC WITH DIFFERENTIAL/PLATELET  PREGNANCY, URINE  CBG MONITORING, ED  TROPONIN I (HIGH SENSITIVITY)    EKG EKG Interpretation  Date/Time:  Tuesday October 23 2019 21:47:21 EDT Ventricular Rate:  88 PR Interval:    QRS Duration: 101 QT Interval:  408 QTC Calculation: 494 R Axis:   -67 Text Interpretation: Sinus rhythm Left anterior fascicular block Abnormal R-wave progression, late transition Nonspecific T abnormalities, anterior leads Borderline prolonged QT interval Confirmed by Raeford Razor [phone]) on 10/23/2019 10:39:34 PM   Radiology DG Chest Portable 1 View  Result Date: 10/23/2019 CLINICAL DATA:  59 year old female with nausea vomiting. EXAM: PORTABLE CHEST 1 VIEW COMPARISON:  Chest radiograph dated 04/07/2019. FINDINGS: No focal consolidation, pleural effusion, pneumothorax. Probable trace fluid along the right minor fissure. The cardiac  silhouette is within limits. No acute osseous pathology. IMPRESSION: No acute cardiopulmonary process. Electronically Signed   By: Elgie Collard M.D.   On: 10/23/2019 21:47    Procedures Procedures (including critical care time)  Medications Ordered in ED Medications  sodium chloride 0.9 % bolus 2,000 mL (2,000 mLs Intravenous New Bag/Given 10/23/19 2154)  insulin aspart (novoLOG) injection 6 Units (6 Units Subcutaneous Given 10/23/19 2155)  ondansetron (ZOFRAN) injection 4 mg (4 mg Intravenous Given 10/23/19 2156)  potassium chloride SA (KLOR-CON) CR tablet 20 mEq (20 mEq Oral Given 10/23/19 2156)    ED Course  I have reviewed the triage vital signs and the nursing notes.  Pertinent labs & imaging results that were available during my care of the patient were reviewed by me and considered in my medical decision making (see chart for details).    MDM Rules/Calculators/A&P                          59 year old female presenting for evaluation of nausea and vomiting.  On arrival blood glucose noted to be elevated above 500.  States she has had a cough recently.  Has been compliant with her insulin.  Reviewed/interpreted labs CBC without leukocytosis or anemia CMP with elevated blood glucose, normal bicarb and no elevated anion gap.  Electrolytes are reassuring.  Normal liver and kidney function. UA not suggestive of UTI Pregnancy test negative  EKG with NSR, LAFB, abnormal R wave progression, nonspecific T abnormalities in the anterior leads, borderline prolonged QT  Chest x-ray reviewed/interpreted - no acute cardiopulmonary process.  Patient given 2 L IV fluids, Zofran, insulin, potassium.  At shift change, care transitioned to Dr. Read Drivers to f/u on troponin and CBG after IVF and insulin.   Final Clinical Impression(s) / ED Diagnoses Final diagnoses:  Hyperglycemia    Rx / DC Orders ED Discharge Orders    None       Karrie Meres, PA-C 10/23/19 2249

## 2019-10-24 LAB — CBG MONITORING, ED: Glucose-Capillary: 334 mg/dL — ABNORMAL HIGH (ref 70–99)

## 2019-10-24 MED ORDER — INSULIN ASPART 100 UNIT/ML ~~LOC~~ SOLN
10.0000 [IU] | Freq: Once | SUBCUTANEOUS | Status: AC
  Administered 2019-10-24: 10 [IU] via SUBCUTANEOUS
  Filled 2019-10-24: qty 10

## 2019-10-24 NOTE — ED Notes (Signed)
PT refusing troponin blood test. Pt states " I want to go home, I feel better". MD informed. PT tolerated po water, no emesis since arrival.

## 2019-10-24 NOTE — ED Notes (Signed)
Dr. Read Drivers aware of pt wanting to leave AMA.

## 2019-10-24 NOTE — ED Provider Notes (Signed)
Nursing notes and vitals signs, including pulse oximetry, reviewed.  Summary of this visit's results, reviewed by myself:  EKG:  EKG Interpretation  Date/Time:  Tuesday October 23 2019 21:47:21 EDT Ventricular Rate:  88 PR Interval:    QRS Duration: 101 QT Interval:  408 QTC Calculation: 494 R Axis:   -67 Text Interpretation: Sinus rhythm Left anterior fascicular block Abnormal R-wave progression, late transition Nonspecific T abnormalities, anterior leads Borderline prolonged QT interval Confirmed by Raeford Razor [phone]) on 10/23/2019 10:39:34 PM       Labs:  Results for orders placed or performed during the hospital encounter of 10/23/19 (from the past 24 hour(s))  CBC with Differential     Status: None   Collection Time: 10/23/19  7:42 PM  Result Value Ref Range   WBC 7.1 4.0 - 10.5 K/uL   RBC 4.64 3.87 - 5.11 MIL/uL   Hemoglobin 14.4 12.0 - 15.0 g/dL   HCT 30.8 36 - 46 %   MCV 94.6 80.0 - 100.0 fL   MCH 31.0 26.0 - 34.0 pg   MCHC 32.8 30.0 - 36.0 g/dL   RDW 65.7 84.6 - 96.2 %   Platelets 205 150 - 400 K/uL   nRBC 0.0 0.0 - 0.2 %   Neutrophils Relative % 49 %   Neutro Abs 3.5 1.7 - 7.7 K/uL   Lymphocytes Relative 43 %   Lymphs Abs 3.1 0.7 - 4.0 K/uL   Monocytes Relative 6 %   Monocytes Absolute 0.4 0 - 1 K/uL   Eosinophils Relative 2 %   Eosinophils Absolute 0.1 0 - 0 K/uL   Basophils Relative 0 %   Basophils Absolute 0.0 0 - 0 K/uL   Immature Granulocytes 0 %   Abs Immature Granulocytes 0.01 0.00 - 0.07 K/uL  Comprehensive metabolic panel     Status: Abnormal   Collection Time: 10/23/19  7:42 PM  Result Value Ref Range   Sodium 135 135 - 145 mmol/L   Potassium 4.4 3.5 - 5.1 mmol/L   Chloride 97 (L) 98 - 111 mmol/L   CO2 27 22 - 32 mmol/L   Glucose, Bld 548 (HH) 70 - 99 mg/dL   BUN 17 6 - 20 mg/dL   Creatinine, Ser 9.52 (H) 0.44 - 1.00 mg/dL   Calcium 9.4 8.9 - 84.1 mg/dL   Total Protein 7.6 6.5 - 8.1 g/dL   Albumin 4.0 3.5 - 5.0 g/dL   AST 13 (L) 15 - 41  U/L   ALT 17 0 - 44 U/L   Alkaline Phosphatase 117 38 - 126 U/L   Total Bilirubin 0.7 0.3 - 1.2 mg/dL   GFR calc non Af Amer 59 (L) >60 mL/min   GFR calc Af Amer >60 >60 mL/min   Anion gap 11 5 - 15  Urinalysis, Routine w reflex microscopic     Status: Abnormal   Collection Time: 10/23/19  7:42 PM  Result Value Ref Range   Color, Urine YELLOW YELLOW   APPearance HAZY (A) CLEAR   Specific Gravity, Urine 1.010 1.005 - 1.030   pH 6.0 5.0 - 8.0   Glucose, UA >=500 (A) NEGATIVE mg/dL   Hgb urine dipstick TRACE (A) NEGATIVE   Bilirubin Urine NEGATIVE NEGATIVE   Ketones, ur NEGATIVE NEGATIVE mg/dL   Protein, ur NEGATIVE NEGATIVE mg/dL   Nitrite NEGATIVE NEGATIVE   Leukocytes,Ua TRACE (A) NEGATIVE  Pregnancy, urine     Status: None   Collection Time: 10/23/19  7:42 PM  Result Value  Ref Range   Preg Test, Ur NEGATIVE NEGATIVE  Urinalysis, Microscopic (reflex)     Status: Abnormal   Collection Time: 10/23/19  7:42 PM  Result Value Ref Range   RBC / HPF 0-5 0 - 5 RBC/hpf   WBC, UA 0-5 0 - 5 WBC/hpf   Bacteria, UA RARE (A) NONE SEEN   Squamous Epithelial / LPF 6-10 0 - 5  CBG monitoring, ED     Status: Abnormal   Collection Time: 10/23/19  9:48 PM  Result Value Ref Range   Glucose-Capillary 465 (H) 70 - 99 mg/dL  SARS Coronavirus 2 by RT PCR (hospital order, performed in [place] [facility] lab) Nasopharyngeal Nasopharyngeal Swab     Status: None   Collection Time: 10/23/19  9:59 PM   Specimen: Nasopharyngeal Swab  Result Value Ref Range   SARS Coronavirus 2 NEGATIVE NEGATIVE  POC CBG, ED     Status: Abnormal   Collection Time: 10/23/19 10:57 PM  Result Value Ref Range   Glucose-Capillary 361 (H) 70 - 99 mg/dL  Troponin I (High Sensitivity)     Status: None   Collection Time: 10/23/19 11:00 PM  Result Value Ref Range   Troponin I (High Sensitivity) 4 <18 ng/L    Imaging Studies: DG Chest Portable 1 View  Result Date: 10/23/2019 CLINICAL DATA:  59 year old female with nausea  vomiting. EXAM: PORTABLE CHEST 1 VIEW COMPARISON:  Chest radiograph dated 04/07/2019. FINDINGS: No focal consolidation, pleural effusion, pneumothorax. Probable trace fluid along the right minor fissure. The cardiac silhouette is within limits. No acute osseous pathology. IMPRESSION: No acute cardiopulmonary process. Electronically Signed   By: Anner Crete M.D.   On: 10/23/2019 21:47   12:42 AM Patient abruptly left AGAINST MEDICAL ADVICE despite glucose still at 361.     Cherokee Boccio, Jenny Reichmann, MD 10/24/19 [phone]

## 2020-01-23 ENCOUNTER — Other Ambulatory Visit: Payer: Self-pay

## 2020-01-23 ENCOUNTER — Encounter (HOSPITAL_BASED_OUTPATIENT_CLINIC_OR_DEPARTMENT_OTHER): Payer: Self-pay | Admitting: *Deleted

## 2020-01-23 ENCOUNTER — Emergency Department (HOSPITAL_BASED_OUTPATIENT_CLINIC_OR_DEPARTMENT_OTHER)
Admission: EM | Admit: 2020-01-23 | Discharge: 2020-01-23 | Disposition: A | Discharge status: Home / Self Care | Payer: 59 | Attending: Emergency Medicine | Admitting: Emergency Medicine

## 2020-01-23 DIAGNOSIS — F17210 Nicotine dependence, cigarettes, uncomplicated: Secondary | ICD-10-CM | POA: Insufficient documentation

## 2020-01-23 DIAGNOSIS — M5442 Lumbago with sciatica, left side: Secondary | ICD-10-CM | POA: Insufficient documentation

## 2020-01-23 DIAGNOSIS — M5441 Lumbago with sciatica, right side: Secondary | ICD-10-CM | POA: Insufficient documentation

## 2020-01-23 DIAGNOSIS — Z7984 Long term (current) use of oral hypoglycemic drugs: Secondary | ICD-10-CM | POA: Insufficient documentation

## 2020-01-23 DIAGNOSIS — E119 Type 2 diabetes mellitus without complications: Secondary | ICD-10-CM | POA: Insufficient documentation

## 2020-01-23 DIAGNOSIS — G8929 Other chronic pain: Secondary | ICD-10-CM | POA: Insufficient documentation

## 2020-01-23 DIAGNOSIS — I10 Essential (primary) hypertension: Secondary | ICD-10-CM | POA: Insufficient documentation

## 2020-01-23 DIAGNOSIS — Z79899 Other long term (current) drug therapy: Secondary | ICD-10-CM | POA: Insufficient documentation

## 2020-01-23 MED ORDER — MORPHINE SULFATE (PF) 4 MG/ML IV SOLN
6.0000 mg | Freq: Once | INTRAVENOUS | Status: AC
  Administered 2020-01-23: 6 mg via INTRAMUSCULAR
  Filled 2020-01-23: qty 2

## 2020-01-23 MED ORDER — LIDOCAINE 5 % EX PTCH: 1.0000 | MEDICATED_PATCH | CUTANEOUS | 0 refills | Status: AC

## 2020-01-23 NOTE — ED Triage Notes (Signed)
C/o back pain after getting up out of chair x 1 day ago , pain radiates down both legs, increased pain with movt

## 2020-01-23 NOTE — ED Provider Notes (Signed)
[facility] EMERGENCY DEPARTMENT Provider Note   CSN: [id number] Arrival date & time: 01/23/20  1434     History Chief Complaint  Patient presents with  . Back Pain    Michelle Hess is a 59 y.o. female.  Patient is a 59 year old female with a history of chronic back pain, diabetes and hypertension who presents with some worsening back pain.  She says that she has pain every day but since yesterday it is gotten worse.  She has pain to the middle of her lower back.  There is radiation down both of her legs.  She says that this is typical for her normal pain it just got worse today.  She denies any fevers.  No recent injuries.  No loss of bowel or bladder control.  No numbness or weakness to her extremities.  She has been going to a pain management clinic but has not been in the last few months she says due to insurance reasons.        Past Medical History:  Diagnosis Date  . Chronic back pain greater than 3 months duration   . Diabetes (HCC)   . Hypertension   . Neuropathy     Patient Active Problem List   Diagnosis Date Noted  . Depression with anxiety 11/07/2017  . Essential hypertension 11/07/2017  . Mixed hyperlipidemia 11/07/2017  . Primary insomnia 11/07/2017  . Tobacco dependence 11/07/2017  . Uncontrolled type 2 diabetes mellitus with peripheral neuropathy (HCC) 11/07/2017  . Vitamin D deficiency 11/07/2017  . Left knee injury, subsequent encounter 08/12/2017  . Upper respiratory tract infection     Past Surgical History:  Procedure Laterality Date  . CHOLECYSTECTOMY    . LEEP    . SHOULDER SURGERY Right      OB History   No obstetric history on file.     Family History  Problem Relation Age of Onset  . Pancreatic cancer Mother   . COPD Father     Social History   Tobacco Use  . Smoking status: Current Every Day Smoker    Packs/day: 0.50    Types: Cigarettes  . Smokeless tobacco: Never Used  Vaping Use  . Vaping Use: Never used    Substance Use Topics  . Alcohol use: No  . Drug use: No    Home Medications Prior to Admission medications   Medication Sig Start Date End Date Taking? Authorizing Provider  amoxicillin (AMOXIL) 500 MG capsule Take 1 capsule (500 mg total) by mouth 3 (three) times daily. 02/26/19   Jacalyn Lefevre, MD  atorvastatin (LIPITOR) 80 MG tablet Take 80 mg by mouth at bedtime. 11/11/17   [provider]  busPIRone (BUSPAR) 15 MG tablet Take 15 mg by mouth 3 (three) times daily as needed. 09/14/18   [provider]  citalopram (CELEXA) 20 MG tablet Take 20 mg by mouth daily. 11/11/17   [provider]  diazepam (VALIUM) 5 MG tablet Take 1 tablet (5 mg total) by mouth 2 (two) times daily. 02/26/19   Jacalyn Lefevre, MD  erythromycin ophthalmic ointment Place a 1/2 inch ribbon of ointment into the lower eyelid. 04/13/19   Ronnie Doss A, PA-C  gabapentin (NEURONTIN) 300 MG capsule Take 300 mg by mouth 3 (three) times daily.    [provider]  HYDROcodone-acetaminophen (NORCO) 5-325 MG tablet Take 1 tablet by mouth every 6 (six) hours as needed for moderate pain. Patient not taking: Reported on 03/07/2019 02/14/19   Lenda Kelp, MD  ibuprofen (ADVIL) 600 MG tablet Take 1 tablet (600 mg total) by mouth every 6 (six) hours as needed. 02/26/19   Jacalyn Lefevre, MD  LANTUS SOLOSTAR 100 UNIT/ML Solostar Pen Inject 25 Units into the skin at bedtime.  03/16/19   [provider]  lidocaine (LIDODERM) 5 % Place 1 patch onto the skin daily. Remove & Discard patch within 12 hours or as directed by MD 01/23/20   Rolan Bucco, MD  lisinopril-hydrochlorothiazide (PRINZIDE,ZESTORETIC) 20-25 MG tablet Take 1 tablet by mouth daily. 10/29/17   [provider]  meloxicam (MOBIC) 15 MG tablet TAKE 1 TABLET BY MOUTH EVERY DAY 05/25/19   Hudnall, Azucena Fallen, MD  metFORMIN (GLUCOPHAGE-XR) 500 MG 24 hr tablet Take by mouth. 11/11/17   [provider]  metoprolol  tartrate (LOPRESSOR) 25 MG tablet SMARTSIG:.5 Tablet(s) By Mouth Twice Daily 04/30/19   [provider]  ondansetron (ZOFRAN) 4 MG tablet Take 1 tablet (4 mg total) by mouth every 6 (six) hours. 03/05/19   Law, Waylan Boga, PA-C  potassium chloride SA (K-DUR) 20 MEQ tablet Take 1 tablet (20 mEq total) by mouth 2 (two) times daily for 2 days. 10/11/18 10/13/18  Alvira Monday, MD    Allergies    Patient has no known allergies.  Review of Systems   Review of Systems  Constitutional: Negative for chills, diaphoresis, fatigue and fever.  HENT: Negative for congestion, rhinorrhea and sneezing.   Eyes: Negative.   Respiratory: Negative for cough, chest tightness and shortness of breath.   Cardiovascular: Negative for chest pain and leg swelling.  Gastrointestinal: Negative for abdominal pain, blood in stool, diarrhea, nausea and vomiting.  Genitourinary: Negative for difficulty urinating, flank pain, frequency and hematuria.  Musculoskeletal: Positive for back pain. Negative for arthralgias.  Skin: Negative for rash.  Neurological: Negative for dizziness, speech difficulty, weakness, numbness and headaches.    Physical Exam Updated Vital Signs BP (!) 168/90   Pulse 88   Temp 98.8 F (37.1 C) (Oral)   Resp 16   Ht 5\' 4"  (1.626 m)   Wt 81.6 kg   SpO2 99%   BMI 30.90 kg/m   Physical Exam Constitutional:      Appearance: She is well-developed.  HENT:     Head: Normocephalic and atraumatic.  Eyes:     Pupils: Pupils are equal, round, and reactive to light.  Cardiovascular:     Rate and Rhythm: Normal rate and regular rhythm.     Heart sounds: Normal heart sounds.  Pulmonary:     Effort: Pulmonary effort is normal. No respiratory distress.     Breath sounds: Normal breath sounds. No wheezing or rales.  Chest:     Chest wall: No tenderness.  Abdominal:     General: Bowel sounds are normal.     Palpations: Abdomen is soft.     Tenderness: There is no abdominal  tenderness. There is no guarding or rebound.  Musculoskeletal:        General: Normal range of motion.     Cervical back: Normal range of motion and neck supple.     Comments: Positive tenderness to the L4-L5 area.  Negative straight leg raise bilaterally.  Patellar reflexes symmetric bilaterally.  She has normal motor function and sensation to light touch to lower extremities.  Pedal pulses intact.  Lymphadenopathy:     Cervical: No cervical adenopathy.  Skin:    General: Skin is warm and dry.     Findings: No rash.  Neurological:  Mental Status: She is alert and oriented to person, place, and time.     ED Results / Procedures / Treatments   Labs (all labs ordered are listed, but only abnormal results are displayed) Labs Reviewed - No data to display  EKG None  Radiology No results found.  Procedures Procedures (including critical care time)  Medications Ordered in ED Medications  morphine 4 MG/ML injection 6 mg (has no administration in time range)    ED Course  I have reviewed the triage vital signs and the nursing notes.  Pertinent labs & imaging results that were available during my care of the patient were reviewed by me and considered in my medical decision making (see chart for details).    MDM Rules/Calculators/A&P                          Patient presents with an exacerbation of her chronic back pain.  She does not have any neurologic deficits or signs of cauda equina.  No recent injuries.  No fevers or other concerns for infectious etiology.  She was discharged home in good condition.  She was given an injection of morphine in the ED.  She was given a prescription for Lidoderm patches to use in conjunction with her Neurontin.  She was encouraged to try to follow back up with her pain management doctor.  I advised her to see if they would work out a payment plan option.  Return precautions were given. Final Clinical Impression(s) / ED Diagnoses Final  diagnoses:  Chronic midline low back pain with bilateral sciatica    Rx / DC Orders ED Discharge Orders         Ordered    lidocaine (LIDODERM) 5 %  Every 24 hours        01/23/20 1707           Rolan Bucco, MD 01/23/20 1711

## 2020-03-02 ENCOUNTER — Emergency Department (HOSPITAL_BASED_OUTPATIENT_CLINIC_OR_DEPARTMENT_OTHER)
Admission: EM | Admit: 2020-03-02 | Discharge: 2020-03-02 | Disposition: A | Discharge status: Home / Self Care | Payer: Self-pay | Attending: Emergency Medicine | Admitting: Emergency Medicine

## 2020-03-02 ENCOUNTER — Encounter (HOSPITAL_BASED_OUTPATIENT_CLINIC_OR_DEPARTMENT_OTHER): Payer: Self-pay

## 2020-03-02 ENCOUNTER — Other Ambulatory Visit: Payer: Self-pay

## 2020-03-02 DIAGNOSIS — Z794 Long term (current) use of insulin: Secondary | ICD-10-CM | POA: Insufficient documentation

## 2020-03-02 DIAGNOSIS — I10 Essential (primary) hypertension: Secondary | ICD-10-CM | POA: Insufficient documentation

## 2020-03-02 DIAGNOSIS — H1032 Unspecified acute conjunctivitis, left eye: Secondary | ICD-10-CM | POA: Insufficient documentation

## 2020-03-02 DIAGNOSIS — Z7984 Long term (current) use of oral hypoglycemic drugs: Secondary | ICD-10-CM | POA: Insufficient documentation

## 2020-03-02 DIAGNOSIS — Z79899 Other long term (current) drug therapy: Secondary | ICD-10-CM | POA: Insufficient documentation

## 2020-03-02 DIAGNOSIS — E1140 Type 2 diabetes mellitus with diabetic neuropathy, unspecified: Secondary | ICD-10-CM | POA: Insufficient documentation

## 2020-03-02 DIAGNOSIS — F17210 Nicotine dependence, cigarettes, uncomplicated: Secondary | ICD-10-CM | POA: Insufficient documentation

## 2020-03-02 MED ORDER — FLUORESCEIN SODIUM 1 MG OP STRP
1.0000 | ORAL_STRIP | Freq: Once | OPHTHALMIC | Status: AC
  Administered 2020-03-02: 1 via OPHTHALMIC
  Filled 2020-03-02: qty 1

## 2020-03-02 MED ORDER — POLYMYXIN B-TRIMETHOPRIM 10000-0.1 UNIT/ML-% OP SOLN
1.0000 [drp] | Freq: Once | OPHTHALMIC | Status: AC
  Administered 2020-03-02: 1 [drp] via OPHTHALMIC
  Filled 2020-03-02: qty 10

## 2020-03-02 MED ORDER — TETRACAINE HCL 0.5 % OP SOLN
2.0000 [drp] | Freq: Once | OPHTHALMIC | Status: AC
  Administered 2020-03-02: 2 [drp] via OPHTHALMIC
  Filled 2020-03-02: qty 4

## 2020-03-02 NOTE — Discharge Instructions (Signed)
Please read and follow all provided instructions.  Your diagnoses today include:  1. Acute bacterial conjunctivitis of left eye     Tests performed today include:  Visual acuity testing to check your vision  Fluorescein dye examination to look for scratches on your eye  Tonometry to check the pressure inside of your eye  Vital signs. See below for your results today.   Medications prescribed:   Polytrim (polymyxin B/trimethoprim) - antibiotic eye drops  Use this medication as follows:  Use 1 drop in affected eye every 4 hours while awake for 10 days. Do not exceed 6 doses in 24 hours.  Take any prescribed medications only as directed.  Home care instructions:  Follow any educational materials contained in this packet. If you wear contact lenses, do not use them until your eye caregiver approves. Follow-up care is necessary to be sure the infection is healing if not completely resolved in 2-3 days. See your caregiver or eye specialist as suggested for followup.   If you have an eye infection, wash your hands often as this is very contagious and is easily spread from person to person.   Follow-up instructions: Please follow-up with the opthalmologist listed in the next 2-3 days for further evaluation of your symptoms if not getting better.  Return instructions:   Please return to the Emergency Department if you experience worsening symptoms.   Please return immediately if you develop severe pain, pus drainage, new change in vision, or fever.  Please return if you have any other emergent concerns.  Additional Information:  Your vital signs today were: BP (!) 160/89 (BP Location: Right Arm)   Pulse 89   Temp 98.8 F (37.1 C) (Oral)   Resp 18   Ht 5\' 4"  (1.626 m)   Wt 83.5 kg   SpO2 98%   BMI 31.58 kg/m  If your blood pressure (BP) was elevated above 135/85 this visit, please have this repeated by your doctor within one month. ---------------

## 2020-03-02 NOTE — ED Triage Notes (Signed)
Pt arrives with pain, swelling, and drainage to left eye X2 days, denies injury but reports itching and feeling like something may be in it.

## 2020-03-02 NOTE — ED Provider Notes (Signed)
[facility] EMERGENCY DEPARTMENT Provider Note   CSN: [id number] Arrival date & time: 03/02/20  5188     History Chief Complaint  Patient presents with  . Eye Pain    Michelle Hess is a 59 y.o. female.  Patient presents to the emergency department for evaluation of left eye drainage, itching, and burning starting 2 days ago.  Patient denies injury or getting anything into her eye.  She does work at a [employer].  Patient has had increasing drainage from her eyes and this morning her eye was crusted shut.  No fevers or swelling around the eye.  No URI symptoms.  She does not wear corrective lenses or contact lenses.  No previous history of surgery to her eyes.        Past Medical History:  Diagnosis Date  . Chronic back pain greater than 3 months duration   . Diabetes (HCC)   . Hypertension   . Neuropathy     Patient Active Problem List   Diagnosis Date Noted  . Depression with anxiety 11/07/2017  . Essential hypertension 11/07/2017  . Mixed hyperlipidemia 11/07/2017  . Primary insomnia 11/07/2017  . Tobacco dependence 11/07/2017  . Uncontrolled type 2 diabetes mellitus with peripheral neuropathy (HCC) 11/07/2017  . Vitamin D deficiency 11/07/2017  . Left knee injury, subsequent encounter 08/12/2017  . Upper respiratory tract infection     Past Surgical History:  Procedure Laterality Date  . CHOLECYSTECTOMY    . LEEP    . SHOULDER SURGERY Right      OB History   No obstetric history on file.     Family History  Problem Relation Age of Onset  . Pancreatic cancer Mother   . COPD Father     Social History   Tobacco Use  . Smoking status: Current Every Day Smoker    Packs/day: 0.50    Types: Cigarettes  . Smokeless tobacco: Never Used  Vaping Use  . Vaping Use: Never used  Substance Use Topics  . Alcohol use: No  . Drug use: No    Home Medications Prior to Admission medications   Medication Sig Start Date End Date Taking?  Authorizing Provider  amoxicillin (AMOXIL) 500 MG capsule Take 1 capsule (500 mg total) by mouth 3 (three) times daily. 02/26/19   Jacalyn Lefevre, MD  atorvastatin (LIPITOR) 80 MG tablet Take 80 mg by mouth at bedtime. 11/11/17   [provider]  busPIRone (BUSPAR) 15 MG tablet Take 15 mg by mouth 3 (three) times daily as needed. 09/14/18   [provider]  citalopram (CELEXA) 20 MG tablet Take 20 mg by mouth daily. 11/11/17   [provider]  diazepam (VALIUM) 5 MG tablet Take 1 tablet (5 mg total) by mouth 2 (two) times daily. 02/26/19   Jacalyn Lefevre, MD  erythromycin ophthalmic ointment Place a 1/2 inch ribbon of ointment into the lower eyelid. 04/13/19   Ronnie Doss A, PA-C  gabapentin (NEURONTIN) 300 MG capsule Take 300 mg by mouth 3 (three) times daily.    [provider]  HYDROcodone-acetaminophen (NORCO) 5-325 MG tablet Take 1 tablet by mouth every 6 (six) hours as needed for moderate pain. Patient not taking: Reported on 03/07/2019 02/14/19   Lenda Kelp, MD  ibuprofen (ADVIL) 600 MG tablet Take 1 tablet (600 mg total) by mouth every 6 (six) hours as needed. 02/26/19   Jacalyn Lefevre, MD  LANTUS SOLOSTAR 100 UNIT/ML Solostar Pen Inject 25 Units into the skin at  bedtime.  03/16/19   [provider]  lidocaine (LIDODERM) 5 % Place 1 patch onto the skin daily. Remove & Discard patch within 12 hours or as directed by MD 01/23/20   Rolan Bucco, MD  lisinopril-hydrochlorothiazide (PRINZIDE,ZESTORETIC) 20-25 MG tablet Take 1 tablet by mouth daily. 10/29/17   [provider]  meloxicam (MOBIC) 15 MG tablet TAKE 1 TABLET BY MOUTH EVERY DAY 05/25/19   Hudnall, Azucena Fallen, MD  metFORMIN (GLUCOPHAGE-XR) 500 MG 24 hr tablet Take by mouth. 11/11/17   [provider]  metoprolol tartrate (LOPRESSOR) 25 MG tablet SMARTSIG:.5 Tablet(s) By Mouth Twice Daily 04/30/19   [provider]  ondansetron (ZOFRAN) 4 MG tablet Take 1 tablet (4  mg total) by mouth every 6 (six) hours. 03/05/19   Law, Waylan Boga, PA-C  potassium chloride SA (K-DUR) 20 MEQ tablet Take 1 tablet (20 mEq total) by mouth 2 (two) times daily for 2 days. 10/11/18 10/13/18  Alvira Monday, MD    Allergies    Patient has no known allergies.  Review of Systems   Review of Systems  Constitutional: Negative for fever.  HENT: Negative for congestion and rhinorrhea.   Eyes: Positive for pain, discharge, redness and itching. Negative for photophobia and visual disturbance.  Gastrointestinal: Negative for nausea and vomiting.    Physical Exam Updated Vital Signs BP (!) 160/89 (BP Location: Right Arm)   Pulse 89   Temp 98.8 F (37.1 C) (Oral)   Resp 18   Ht 5\' 4"  (1.626 m)   Wt 83.5 kg   SpO2 98%   BMI 31.58 kg/m   Physical Exam Vitals and nursing note reviewed.  Constitutional:      Appearance: She is well-developed.  HENT:     Head: Normocephalic and atraumatic.  Eyes:     General: Lids are normal. Vision grossly intact.     Intraocular pressure: Left eye pressure is 20 mmHg. Measurements were taken using a handheld tonometer.    Extraocular Movements: Extraocular movements intact.     Right eye: Normal extraocular motion.     Left eye: Normal extraocular motion.     Conjunctiva/sclera:     Right eye: Right conjunctiva is not injected. No chemosis, exudate or hemorrhage.    Left eye: Left conjunctiva is injected. Exudate present. No chemosis or hemorrhage.    Pupils:     Left eye: No corneal abrasion or fluorescein uptake.     Slit lamp exam:    Left eye: No corneal ulcer.  Pulmonary:     Effort: No respiratory distress.  Musculoskeletal:     Cervical back: Normal range of motion and neck supple.  Skin:    General: Skin is warm and dry.  Neurological:     Mental Status: She is alert.     ED Results / Procedures / Treatments   Labs (all labs ordered are listed, but only abnormal results are displayed) Labs Reviewed - No data to  display  EKG None  Radiology No results found.  Procedures Procedures (including critical care time)  Medications Ordered in ED Medications  fluorescein ophthalmic strip 1 strip (has no administration in time range)  tetracaine (PONTOCAINE) 0.5 % ophthalmic solution 2 drop (has no administration in time range)    ED Course  I have reviewed the triage vital signs and the nursing notes.  Pertinent labs & imaging results that were available during my care of the patient were reviewed by me and considered in my medical decision making (  see chart for details).  Patient seen and examined. Work-up initiated. Medications ordered.   Vital signs reviewed and are as follows: BP (!) 160/89 (BP Location: Right Arm)   Pulse 89   Temp 98.8 F (37.1 C) (Oral)   Resp 18   Ht 5\' 4"  (1.626 m)   Wt 83.5 kg   SpO2 98%   BMI 31.58 kg/m   Two drops of tetracaine instilled into affected eye.   Fluorescein strip applied to affected eye. Wood's lamp used to assess for corneal abrasion. No corneal abrasion identified. No foreign bodies noted. No visible hyphema.   Tonometry performed. Left eye pressure: 20  Patient tolerated procedure well without immediate complication.   Plan: Continued Tylenol for pain, Polytrim drops, ophthalmology follow-up in 48 to 72 hours if not improving.  Visual acuity was checked and was normal.  Please see RN note.     MDM Rules/Calculators/A&P                          Patient with what appears to be bacterial conjunctivitis.  No foreign bodies noted. No surrounding erythema, swelling, vision changes/loss suspicious for orbital or periorbital cellulitis. No signs of iritis. No signs of glaucoma, intraocular pressure normal. No symptoms of retinal detachment. No ophthalmologic emergency suspected. Outpatient referral given in case of no improvement.   Final Clinical Impression(s) / ED Diagnoses Final diagnoses:  Acute bacterial conjunctivitis of left eye     Rx / DC Orders ED Discharge Orders    None       , PA-C 03/02/20 1112    03/04/20, MD 03/03/20 [phone]

## 2020-03-02 NOTE — ED Notes (Signed)
Visual Acuity - Pt right eye 25/20 and left eye 20/20

## 2020-04-21 ENCOUNTER — Encounter (HOSPITAL_BASED_OUTPATIENT_CLINIC_OR_DEPARTMENT_OTHER): Payer: Self-pay

## 2020-04-21 ENCOUNTER — Emergency Department (HOSPITAL_BASED_OUTPATIENT_CLINIC_OR_DEPARTMENT_OTHER): Payer: Self-pay

## 2020-04-21 ENCOUNTER — Other Ambulatory Visit: Payer: Self-pay

## 2020-04-21 ENCOUNTER — Emergency Department (HOSPITAL_BASED_OUTPATIENT_CLINIC_OR_DEPARTMENT_OTHER)
Admission: EM | Admit: 2020-04-21 | Discharge: 2020-04-22 | Disposition: A | Discharge status: Home / Self Care | Payer: Self-pay | Attending: Emergency Medicine | Admitting: Emergency Medicine

## 2020-04-21 DIAGNOSIS — Z79899 Other long term (current) drug therapy: Secondary | ICD-10-CM | POA: Insufficient documentation

## 2020-04-21 DIAGNOSIS — Z20822 Contact with and (suspected) exposure to covid-19: Secondary | ICD-10-CM | POA: Insufficient documentation

## 2020-04-21 DIAGNOSIS — F17210 Nicotine dependence, cigarettes, uncomplicated: Secondary | ICD-10-CM | POA: Insufficient documentation

## 2020-04-21 DIAGNOSIS — R42 Dizziness and giddiness: Secondary | ICD-10-CM | POA: Insufficient documentation

## 2020-04-21 DIAGNOSIS — R110 Nausea: Secondary | ICD-10-CM | POA: Insufficient documentation

## 2020-04-21 DIAGNOSIS — I10 Essential (primary) hypertension: Secondary | ICD-10-CM | POA: Insufficient documentation

## 2020-04-21 DIAGNOSIS — E1140 Type 2 diabetes mellitus with diabetic neuropathy, unspecified: Secondary | ICD-10-CM | POA: Insufficient documentation

## 2020-04-21 LAB — COMPREHENSIVE METABOLIC PANEL
ALT: 13 U/L (ref 0–44)
AST: 13 U/L — ABNORMAL LOW (ref 15–41)
Albumin: 4.2 g/dL (ref 3.5–5.0)
Alkaline Phosphatase: 82 U/L (ref 38–126)
Anion gap: 10 (ref 5–15)
BUN: 19 mg/dL (ref 6–20)
CO2: 28 mmol/L (ref 22–32)
Calcium: 9.4 mg/dL (ref 8.9–10.3)
Chloride: 102 mmol/L (ref 98–111)
Creatinine, Ser: 0.82 mg/dL (ref 0.44–1.00)
GFR, Estimated: >60 mL/min (ref >60)
Glucose, Bld: 163 mg/dL — ABNORMAL HIGH (ref 70–99)
Potassium: 3.5 mmol/L (ref 3.5–5.1)
Sodium: 140 mmol/L (ref 135–145)
Total Bilirubin: 0.5 mg/dL (ref 0.3–1.2)
Total Protein: 7.5 g/dL (ref 6.5–8.1)

## 2020-04-21 LAB — CBC
HCT: 43 % (ref 36.0–46.0)
Hemoglobin: 14.3 g/dL (ref 12.0–15.0)
MCH: 30.7 pg (ref 26.0–34.0)
MCHC: 33.3 g/dL (ref 30.0–36.0)
MCV: 92.3 fL (ref 80.0–100.0)
Platelets: 267 10*3/uL (ref 150–400)
RBC: 4.66 MIL/uL (ref 3.87–5.11)
RDW: 13.6 % (ref 11.5–15.5)
WBC: 7 10*3/uL (ref 4.0–10.5)
nRBC: 0 % (ref 0.0–0.2)

## 2020-04-21 LAB — RESP PANEL BY RT-PCR (FLU A&B, COVID) ARPGX2
Influenza A by PCR: NEGATIVE
Influenza B by PCR: NEGATIVE
SARS Coronavirus 2 by RT PCR: NEGATIVE

## 2020-04-21 LAB — LIPASE, BLOOD: Lipase: 20 U/L (ref 11–51)

## 2020-04-21 LAB — CBG MONITORING, ED: Glucose-Capillary: 171 mg/dL — ABNORMAL HIGH (ref 70–99)

## 2020-04-21 MED ORDER — ONDANSETRON 4 MG PO TBDP
4.0000 mg | ORAL_TABLET | Freq: Once | ORAL | Status: AC | PRN
  Administered 2020-04-21: 19:00:00 4 mg via ORAL
  Filled 2020-04-21: qty 1

## 2020-04-21 MED ORDER — SODIUM CHLORIDE 0.9 % IV BOLUS
500.0000 mL | Freq: Once | INTRAVENOUS | Status: AC
  Administered 2020-04-21: 23:00:00 500 mL via INTRAVENOUS

## 2020-04-21 MED ORDER — IOHEXOL 350 MG/ML SOLN
100.0000 mL | Freq: Once | INTRAVENOUS | Status: AC | PRN
  Administered 2020-04-21: 23:00:00 80 mL via INTRAVENOUS

## 2020-04-21 MED ORDER — MECLIZINE HCL 25 MG PO TABS
25.0000 mg | ORAL_TABLET | Freq: Once | ORAL | Status: AC
  Administered 2020-04-21: 23:00:00 25 mg via ORAL
  Filled 2020-04-21: qty 1

## 2020-04-21 NOTE — ED Triage Notes (Signed)
Pt arrives stating she has headache, nausea and feels dizzy, has been going on since Friday. Denies weakness, aphasia. Also complains of back pain and had heart burn Thursday and Friday.

## 2020-04-21 NOTE — ED Provider Notes (Signed)
[facility] EMERGENCY DEPARTMENT Provider Note   CSN: [id number] Arrival date & time: 04/21/20  1836     History Chief Complaint  Patient presents with   Dizziness   Nausea    Michelle Hess is a 59 y.o. female.  Presents to ER with concern for dizziness.  Patient reports symptoms ongoing since Thursday.  Dizziness seems to come and go at random, no pattern.  Relatively constant throughout the day, is made worse with sudden movements.  Has had some associated nausea but no vomiting.  No episodes of syncope.  No chest pain or difficulty in breathing.  No ear pain.  Diabetes, hypertension, no prior CVA.  HPI     Past Medical History:  Diagnosis Date   Chronic back pain greater than 3 months duration    Diabetes (HCC)    Hypertension    Neuropathy     Patient Active Problem List   Diagnosis Date Noted   Depression with anxiety 11/07/2017   Essential hypertension 11/07/2017   Mixed hyperlipidemia 11/07/2017   Primary insomnia 11/07/2017   Tobacco dependence 11/07/2017   Uncontrolled type 2 diabetes mellitus with peripheral neuropathy (HCC) 11/07/2017   Vitamin D deficiency 11/07/2017   Left knee injury, subsequent encounter 08/12/2017   Upper respiratory tract infection     Past Surgical History:  Procedure Laterality Date   CHOLECYSTECTOMY     LEEP     SHOULDER SURGERY Right      OB History   No obstetric history on file.     Family History  Problem Relation Age of Onset   Pancreatic cancer Mother    COPD Father     Social History   Tobacco Use   Smoking status: Current Every Day Smoker    Packs/day: 0.50    Types: Cigarettes   Smokeless tobacco: Never Used  Vaping Use   Vaping Use: Never used  Substance Use Topics   Alcohol use: No   Drug use: No    Home Medications Prior to Admission medications   Medication Sig Start Date End Date Taking? Authorizing Provider  atorvastatin (LIPITOR) 80 MG tablet Take 80  mg by mouth at bedtime. 11/11/17   [provider]  busPIRone (BUSPAR) 15 MG tablet Take 15 mg by mouth 3 (three) times daily as needed. 09/14/18   [provider]  citalopram (CELEXA) 20 MG tablet Take 20 mg by mouth daily. 11/11/17   [provider]  erythromycin ophthalmic ointment Place a 1/2 inch ribbon of ointment into the lower eyelid. 04/13/19   Ronnie Doss A, PA-C  gabapentin (NEURONTIN) 300 MG capsule Take 300 mg by mouth 3 (three) times daily.    [provider]  ibuprofen (ADVIL) 600 MG tablet Take 1 tablet (600 mg total) by mouth every 6 (six) hours as needed. 02/26/19   Jacalyn Lefevre, MD  LANTUS SOLOSTAR 100 UNIT/ML Solostar Pen Inject 25 Units into the skin at bedtime.  03/16/19   [provider]  lidocaine (LIDODERM) 5 % Place 1 patch onto the skin daily. Remove & Discard patch within 12 hours or as directed by MD 01/23/20   Rolan Bucco, MD  lisinopril-hydrochlorothiazide (PRINZIDE,ZESTORETIC) 20-25 MG tablet Take 1 tablet by mouth daily. 10/29/17   [provider]  meloxicam (MOBIC) 15 MG tablet TAKE 1 TABLET BY MOUTH EVERY DAY 05/25/19   Hudnall, Azucena Fallen, MD  metoprolol tartrate (LOPRESSOR) 25 MG tablet SMARTSIG:.5 Tablet(s) By Mouth Twice Daily 04/30/19   [provider]  ondansetron (ZOFRAN) 4 MG tablet Take 1 tablet (4 mg total) by mouth every 6 (six) hours. 03/05/19   Law, Waylan Boga, PA-C  potassium chloride SA (K-DUR) 20 MEQ tablet Take 1 tablet (20 mEq total) by mouth 2 (two) times daily for 2 days. 10/11/18 10/13/18  Alvira Monday, MD    Allergies    Patient has no known allergies.  Review of Systems   Review of Systems  Constitutional: Negative for chills and fever.  HENT: Negative for ear pain and sore throat.   Eyes: Negative for pain and visual disturbance.  Respiratory: Negative for cough and shortness of breath.   Cardiovascular: Negative for chest pain and palpitations.  Gastrointestinal:  Negative for abdominal pain and vomiting.  Genitourinary: Negative for dysuria and hematuria.  Musculoskeletal: Negative for arthralgias and back pain.  Skin: Negative for color change and rash.  Neurological: Positive for dizziness. Negative for seizures and syncope.  All other systems reviewed and are negative.   Physical Exam Updated Vital Signs BP (!) 150/80 (BP Location: Right Arm)    Pulse 76    Temp 98.3 F (36.8 C) (Oral)    Resp 18    Ht 5\' 4"  (1.626 m)    Wt 82.6 kg    SpO2 97%    BMI 31.24 kg/m   Physical Exam Vitals and nursing note reviewed.  Constitutional:      General: She is not in acute distress.    Appearance: She is well-developed and well-nourished.  HENT:     Head: Normocephalic and atraumatic.     Right Ear: Tympanic membrane normal.     Left Ear: Tympanic membrane normal.  Eyes:     Conjunctiva/sclera: Conjunctivae normal.  Cardiovascular:     Rate and Rhythm: Normal rate and regular rhythm.     Heart sounds: No murmur heard.   Pulmonary:     Effort: Pulmonary effort is normal. No respiratory distress.     Breath sounds: Normal breath sounds.  Abdominal:     Palpations: Abdomen is soft.     Tenderness: There is no abdominal tenderness.  Musculoskeletal:        General: No edema.     Cervical back: Neck supple.  Skin:    General: Skin is warm and dry.  Neurological:     Mental Status: She is alert.     Comments: AAOx3 CN 2-12 intact, speech clear visual fields intact 5/5 strength in b/l UE and LE Sensation to light touch intact in b/l UE and LE Normal FNF  Psychiatric:        Mood and Affect: Mood and affect and mood normal.     ED Results / Procedures / Treatments   Labs (all labs ordered are listed, but only abnormal results are displayed) Labs Reviewed  COMPREHENSIVE METABOLIC PANEL - Abnormal; Notable for the following components:      Result Value   Glucose, Bld 163 (*)    AST 13 (*)    All other components within normal limits   CBG MONITORING, ED - Abnormal; Notable for the following components:   Glucose-Capillary 171 (*)    All other components within normal limits  RESP PANEL BY RT-PCR (FLU A&B, COVID) ARPGX2  LIPASE, BLOOD  CBC  URINALYSIS, ROUTINE W REFLEX MICROSCOPIC  PREGNANCY, URINE    EKG EKG Interpretation  Date/Time:  Monday April 21 2020 18:57:28 EST Ventricular Rate:  86 PR Interval:  158 QRS Duration: 96 QT Interval:  396 QTC Calculation:  473 R Axis:   -64 Text Interpretation: Normal sinus rhythm Left axis deviation Pulmonary disease pattern Nonspecific T wave abnormality Prolonged QT Abnormal ECG Confirmed by Marianna Fuss ([id number]) on 04/21/2020 9:47:48 PM   Radiology No results found.  Procedures Procedures (including critical care time)  Medications Ordered in ED Medications  ondansetron (ZOFRAN-ODT) disintegrating tablet 4 mg (4 mg Oral Given 04/21/20 1916)  sodium chloride 0.9 % bolus 500 mL (500 mLs Intravenous New Bag/Given 04/21/20 2307)  meclizine (ANTIVERT) tablet 25 mg (25 mg Oral Given 04/21/20 2300)  iohexol (OMNIPAQUE) 350 MG/ML injection 100 mL (80 mLs Intravenous Contrast Given 04/21/20 2324)    ED Course  I have reviewed the triage vital signs and the nursing notes.  Pertinent labs & imaging results that were available during my care of the patient were reviewed by me and considered in my medical decision making (see chart for details).    MDM Rules/Calculators/A&P                         59 year old lady presented to ER with concern for dizziness, described room spinning sensation.  On exam patient noted to be well-appearing in no distress.  Vital stable.  EKG without acute ischemic change, no chest pain, doubt ACS.  Labs grossly stable.  Normal neurologic exam.  Given positional nature lower suspicion for stroke and suspect most likely BPPV.  Nevertheless, given medical comorbidities, will check CTA head and neck.  Will provide fluids, meclizine.  While  awaiting CT imaging, signed out to Dr. Wilkie Aye.  Final Clinical Impression(s) / ED Diagnoses Final diagnoses:  Vertigo    Rx / DC Orders ED Discharge Orders    None       Milagros Loll, MD 04/21/20 2344

## 2020-04-21 NOTE — ED Notes (Signed)
Pt in radiology, will update vitals when pt returns to room

## 2020-04-22 LAB — URINALYSIS, ROUTINE W REFLEX MICROSCOPIC
Bilirubin Urine: NEGATIVE
Glucose, UA: NEGATIVE mg/dL
Ketones, ur: NEGATIVE mg/dL
Leukocytes,Ua: NEGATIVE
Nitrite: NEGATIVE
Protein, ur: NEGATIVE mg/dL
Specific Gravity, Urine: 1.02 (ref 1.005–1.030)
pH: 5.5 (ref 5.0–8.0)

## 2020-04-22 LAB — URINALYSIS, MICROSCOPIC (REFLEX)
Bacteria, UA: NONE SEEN
WBC, UA: NONE SEEN WBC/hpf (ref 0–5)

## 2020-04-22 LAB — PREGNANCY, URINE: Preg Test, Ur: NEGATIVE

## 2020-04-22 MED ORDER — MECLIZINE HCL 25 MG PO TABS: 25.0000 mg | ORAL_TABLET | Freq: Three times a day (TID) | ORAL | 0 refills | Status: AC | PRN

## 2020-04-22 NOTE — ED Provider Notes (Signed)
  Patient signed out pending CTA and reassessment.  Patient presents with dizziness.  History and physical exam consistent with vertigo.  No prior history.  CTA obtained to rule out posterior circulation abnormality.  CT is negative.  On recheck, patient states she feels much better.  She is no longer dizzy.  She is ambulatory to the bathroom without issue.  Will discharge home with meclizine  Physical Exam  BP 128/74   Pulse 74   Temp 98 F (36.7 C)   Resp 17   Ht 1.626 m (5\' 4" )   Wt 82.6 kg   SpO2 99%   BMI 31.24 kg/m      , MD 04/22/20 0100

## 2020-04-22 NOTE — Discharge Instructions (Signed)
You were seen today for dizziness.  Your work-up was reassuring.  This is consistent with vertigo.  Take medications as prescribed.

## 2020-04-22 NOTE — ED Notes (Signed)
Pt ambulated in hallway to restroom w/o issue.

## 2020-05-02 ENCOUNTER — Emergency Department (HOSPITAL_BASED_OUTPATIENT_CLINIC_OR_DEPARTMENT_OTHER)
Admission: EM | Admit: 2020-05-02 | Discharge: 2020-05-02 | Disposition: A | Discharge status: Home / Self Care | Payer: Self-pay | Attending: Emergency Medicine | Admitting: Emergency Medicine

## 2020-05-02 ENCOUNTER — Other Ambulatory Visit: Payer: Self-pay

## 2020-05-02 ENCOUNTER — Encounter (HOSPITAL_BASED_OUTPATIENT_CLINIC_OR_DEPARTMENT_OTHER): Payer: Self-pay | Admitting: *Deleted

## 2020-05-02 DIAGNOSIS — Z5321 Procedure and treatment not carried out due to patient leaving prior to being seen by health care provider: Secondary | ICD-10-CM | POA: Insufficient documentation

## 2020-05-02 DIAGNOSIS — R519 Headache, unspecified: Secondary | ICD-10-CM | POA: Insufficient documentation

## 2020-05-02 DIAGNOSIS — M791 Myalgia, unspecified site: Secondary | ICD-10-CM | POA: Insufficient documentation

## 2020-05-02 MED ORDER — IBUPROFEN 400 MG PO TABS
ORAL_TABLET | ORAL | Status: AC
  Administered 2020-05-02: 400 mg via ORAL
  Filled 2020-05-02: qty 1

## 2020-05-02 MED ORDER — ACETAMINOPHEN 325 MG PO TABS
650.0000 mg | ORAL_TABLET | Freq: Once | ORAL | Status: DC
Start: 2020-05-02 — End: 2020-05-02

## 2020-05-02 MED ORDER — IBUPROFEN 400 MG PO TABS: 400.0000 mg | ORAL_TABLET | Freq: Once | ORAL | Status: AC | PRN

## 2020-05-02 NOTE — ED Notes (Signed)
No answer when pt called to be examined by provider in triage. Called for patient in [facility], front lobby and called for patient outside. Provider notified

## 2020-05-02 NOTE — ED Triage Notes (Signed)
Pt reports she got covid booster shot on 12/28. States the next day she began having pain in hands, feet, legs, head, back. Reports she hasn't been able to rest due to pain. Also c/o headache. She has not taken any medication today

## 2020-05-02 NOTE — ED Provider Notes (Signed)
Signed up for the patient to see her.  However her name was called several times and she cannot be found apparently was waiting in the triage/waiting room and eloped prior to my evaluation.  Patient left without being seen by myself.     Cheryll Cockayne, MD 05/02/20 [phone]

## 2020-06-26 ENCOUNTER — Emergency Department (HOSPITAL_BASED_OUTPATIENT_CLINIC_OR_DEPARTMENT_OTHER)
Admission: EM | Admit: 2020-06-26 | Discharge: 2020-06-26 | Disposition: A | Discharge status: Home / Self Care | Payer: 59 | Attending: Emergency Medicine | Admitting: Emergency Medicine

## 2020-06-26 ENCOUNTER — Other Ambulatory Visit: Payer: Self-pay

## 2020-06-26 ENCOUNTER — Emergency Department (HOSPITAL_BASED_OUTPATIENT_CLINIC_OR_DEPARTMENT_OTHER): Payer: 59

## 2020-06-26 ENCOUNTER — Encounter (HOSPITAL_BASED_OUTPATIENT_CLINIC_OR_DEPARTMENT_OTHER): Payer: Self-pay

## 2020-06-26 DIAGNOSIS — Z79899 Other long term (current) drug therapy: Secondary | ICD-10-CM | POA: Diagnosis not present

## 2020-06-26 DIAGNOSIS — I10 Essential (primary) hypertension: Secondary | ICD-10-CM | POA: Diagnosis not present

## 2020-06-26 DIAGNOSIS — Z794 Long term (current) use of insulin: Secondary | ICD-10-CM | POA: Diagnosis not present

## 2020-06-26 DIAGNOSIS — M47892 Other spondylosis, cervical region: Secondary | ICD-10-CM | POA: Insufficient documentation

## 2020-06-26 DIAGNOSIS — E1140 Type 2 diabetes mellitus with diabetic neuropathy, unspecified: Secondary | ICD-10-CM | POA: Diagnosis not present

## 2020-06-26 DIAGNOSIS — M479 Spondylosis, unspecified: Secondary | ICD-10-CM

## 2020-06-26 DIAGNOSIS — F17210 Nicotine dependence, cigarettes, uncomplicated: Secondary | ICD-10-CM | POA: Insufficient documentation

## 2020-06-26 DIAGNOSIS — M542 Cervicalgia: Secondary | ICD-10-CM | POA: Diagnosis present

## 2020-06-26 MED ORDER — OXYCODONE-ACETAMINOPHEN 5-325 MG PO TABS
1.0000 | ORAL_TABLET | Freq: Once | ORAL | Status: AC
  Administered 2020-06-26: 1 via ORAL
  Filled 2020-06-26: qty 1

## 2020-06-26 MED ORDER — OXYCODONE-ACETAMINOPHEN 5-325 MG PO TABS: 1.0000 | ORAL_TABLET | Freq: Three times a day (TID) | ORAL | 0 refills | Status: AC | PRN

## 2020-06-26 NOTE — Discharge Instructions (Signed)
Your CT scan as below, as we discussed you do have some narrowing through your canal causing your pain.  I want you to follow-up with a spine specialist, their information is above.  I want you to take Tylenol directed on the bottle for pain, the pain is very good take the Percocet as prescribed.  Do note that this is a narcotic and can cause drowsiness, do not operate heavy machinery or drink alcohol when taking this medication.  Please take your pharmacist about all side effects of this medication.  If you have any new worsening concerning symptoms please come back to the emergency department.   MPRESSION:  1. 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  2. No acute cervical spine fracture.

## 2020-06-26 NOTE — ED Provider Notes (Signed)
[facility] EMERGENCY DEPARTMENT Provider Note   CSN: [id number] Arrival date & time: 06/26/20  1630     History Chief Complaint  Patient presents with  . Neck Pain    Michelle Hess is a 60 y.o. female with pertinent past medical history of neuropathy, hypertension, diabetes, chronic back pain that presents to the emergency department today for neck pain and left upper extremity pain for the past 3 weeks.  Patient states that for 3 weeks she has had back pain that radiates down into her left upper arm, denies any numbness or tingling.  Patient states that the pain was a 10 out of 10, was [facility] who did x-rays at the time and sent her home with Flexeril, states that Flexeril has not been working.  States that she has not been taking any Tylenol or ibuprofen for the pain.  Patient states that yesterday she fell in the shower and the shower pole fell onto her neck and left shoulder, exacerbating the pain which is why she is here.  Still has been taking anything for this.  Denies any numbness and tingling down into her arm.  Denies any weakness, however is unable to move her arm up due to pain.  States that he is never had anything like this before.  Denies any trauma or falls prior to that the year 8 weeks.  States that pain is worse at night.  Denies any fevers or chills.  Denies any nausea or vomiting.  No other complaints at this time.  No chest pain or shortness of breath.  No cardiac history.  HPI     Past Medical History:  Diagnosis Date  . Chronic back pain greater than 3 months duration   . Diabetes (HCC)   . Hypertension   . Neuropathy     Patient Active Problem List   Diagnosis Date Noted  . Depression with anxiety 11/07/2017  . Essential hypertension 11/07/2017  . Mixed hyperlipidemia 11/07/2017  . Primary insomnia 11/07/2017  . Tobacco dependence 11/07/2017  . Uncontrolled type 2 diabetes mellitus with peripheral neuropathy (HCC)  11/07/2017  . Vitamin D deficiency 11/07/2017  . Left knee injury, subsequent encounter 08/12/2017  . Upper respiratory tract infection     Past Surgical History:  Procedure Laterality Date  . CHOLECYSTECTOMY    . LEEP    . SHOULDER SURGERY Right      OB History   No obstetric history on file.     Family History  Problem Relation Age of Onset  . Pancreatic cancer Mother   . COPD Father     Social History   Tobacco Use  . Smoking status: Current Every Day Smoker    Packs/day: 0.50    Types: Cigarettes  . Smokeless tobacco: Never Used  Vaping Use  . Vaping Use: Never used  Substance Use Topics  . Alcohol use: No  . Drug use: No    Home Medications Prior to Admission medications   Medication Sig Start Date End Date Taking? Authorizing Provider  oxyCODONE-acetaminophen (PERCOCET/ROXICET) 5-325 MG tablet Take 1 tablet by mouth every 8 (eight) hours as needed for severe pain. 06/26/20  Yes Farrel GordonPatel, , PA-C  atorvastatin (LIPITOR) 80 MG tablet Take 80 mg by mouth at bedtime. 11/11/17   [provider]  busPIRone (BUSPAR) 15 MG tablet Take 15 mg by mouth 3 (three) times daily as needed. 09/14/18   [provider]  citalopram (CELEXA) 20 MG tablet Take 20  mg by mouth daily. 11/11/17   [provider]  erythromycin ophthalmic ointment Place a 1/2 inch ribbon of ointment into the lower eyelid. 04/13/19   Ronnie Doss A, PA-C  gabapentin (NEURONTIN) 300 MG capsule Take 300 mg by mouth 3 (three) times daily.    [provider]  ibuprofen (ADVIL) 600 MG tablet Take 1 tablet (600 mg total) by mouth every 6 (Jaleisa Brose) hours as needed. 02/26/19   Jacalyn Lefevre, MD  LANTUS SOLOSTAR 100 UNIT/ML Solostar Pen Inject 25 Units into the skin at bedtime.  03/16/19   [provider]  lidocaine (LIDODERM) 5 % Place 1 patch onto the skin daily. Remove & Discard patch within 12 hours or as directed by MD 01/23/20   Rolan Bucco, MD   lisinopril-hydrochlorothiazide (PRINZIDE,ZESTORETIC) 20-25 MG tablet Take 1 tablet by mouth daily. 10/29/17   [provider]  meclizine (ANTIVERT) 25 MG tablet Take 1 tablet (25 mg total) by mouth 3 (three) times daily as needed for dizziness. 04/22/20   Horton, Mayer Masker, MD  meloxicam (MOBIC) 15 MG tablet TAKE 1 TABLET BY MOUTH EVERY DAY 05/25/19   Hudnall, Azucena Fallen, MD  metoprolol tartrate (LOPRESSOR) 25 MG tablet SMARTSIG:.5 Tablet(s) By Mouth Twice Daily 04/30/19   [provider]  ondansetron (ZOFRAN) 4 MG tablet Take 1 tablet (4 mg total) by mouth every 6 (Kailany Dinunzio) hours. 03/05/19   Law, Waylan Boga, PA-C  potassium chloride SA (K-DUR) 20 MEQ tablet Take 1 tablet (20 mEq total) by mouth 2 (two) times daily for 2 days. 10/11/18 10/13/18  Alvira Monday, MD    Allergies    Patient has no known allergies.  Review of Systems   Review of Systems  Constitutional: Negative for chills, diaphoresis, fatigue and fever.  HENT: Negative for congestion, sore throat and trouble swallowing.   Eyes: Negative for pain and visual disturbance.  Respiratory: Negative for cough, shortness of breath and wheezing.   Cardiovascular: Negative for chest pain, palpitations and leg swelling.  Gastrointestinal: Negative for abdominal distention, abdominal pain, diarrhea, nausea and vomiting.  Genitourinary: Negative for difficulty urinating.  Musculoskeletal: Positive for arthralgias. Negative for back pain, neck pain and neck stiffness.  Skin: Negative for pallor.  Neurological: Negative for dizziness, speech difficulty, weakness and headaches.  Psychiatric/Behavioral: Negative for confusion.    Physical Exam Updated Vital Signs BP (!) 163/95 (BP Location: Right Arm)   Pulse 84   Temp 98.8 F (37.1 C) (Oral)   Resp 20   Ht 5\' 4"  (1.626 m)   Wt 86.2 kg   SpO2 100%   BMI 32.61 kg/m   Physical Exam Constitutional:      General: She is not in acute distress.    Appearance: Normal  appearance. She is not ill-appearing, toxic-appearing or diaphoretic.  HENT:     Mouth/Throat:     Mouth: Mucous membranes are moist.     Pharynx: Oropharynx is clear.  Eyes:     General: No scleral icterus.    Extraocular Movements: Extraocular movements intact.     Pupils: Pupils are equal, round, and reactive to light.  Neck:     Comments: No cervical midline or paraspinal tenderness.  Patient does have pain in her neck when she extends neck.  Is able to fully extend, flex and adduct neck.  Cardiovascular:     Rate and Rhythm: Normal rate and regular rhythm.     Pulses: Normal pulses.     Heart sounds: Normal heart sounds.  Pulmonary:  Effort: Pulmonary effort is normal. No respiratory distress.     Breath sounds: Normal breath sounds. No stridor. No wheezing, rhonchi or rales.  Chest:     Chest wall: No tenderness.  Abdominal:     General: Abdomen is flat. There is no distension.     Palpations: Abdomen is soft.     Tenderness: There is no abdominal tenderness. There is no guarding or rebound.  Musculoskeletal:        General: No swelling or tenderness. Normal range of motion.     Cervical back: Normal range of motion and neck supple. No rigidity.     Right lower leg: No edema.     Left lower leg: No edema.     Comments: Patient with no tenderness to palpation of left shoulder or left neck.  Unable to range shoulder more than 90 degrees abduction or flexion.  Is able to place hand behind her back.  No problems with a duction.  No tenderness over shoulder joint, no erythema or warmth.  Normal strength to shoulder, elbow wrist and all digits.  Normal opposition.  Normal cap refill.  Radial pulse 2+.  Normal sensation throughout.  Skin:    General: Skin is warm and dry.     Capillary Refill: Capillary refill takes less than 2 seconds.     Coloration: Skin is not pale.  Neurological:     General: No focal deficit present.     Mental Status: She is alert and oriented to person,  place, and time.  Psychiatric:        Mood and Affect: Mood normal.        Behavior: Behavior normal.     ED Results / Procedures / Treatments   Labs (all labs ordered are listed, but only abnormal results are displayed) Labs Reviewed - No data to display  EKG None  Radiology CT Head Wo Contrast  Result Date: 06/26/2020 CLINICAL DATA:  Neck and left upper extremity pain for 3 weeks, head trauma EXAM: CT HEAD WITHOUT CONTRAST TECHNIQUE: Contiguous axial images were obtained from the base of the skull through the vertex without intravenous contrast. COMPARISON:  04/21/2020 FINDINGS: Brain: No acute infarct or hemorrhage. Lateral ventricles and midline structures are unremarkable. No acute extra-axial fluid collections. No mass effect. Vascular: No hyperdense vessel or unexpected calcification. Skull: Normal. Negative for fracture or focal lesion. Sinuses/Orbits: No acute finding. Other: None. IMPRESSION: 1. Stable head CT.  No acute intracranial process. Electronically Signed   By: Sharlet Salina M.D.   On: 06/26/2020 18:22   CT Cervical Spine Wo Contrast  Result Date: 06/26/2020 CLINICAL DATA:  Neck and left upper extremity pain for 3 weeks, neck trauma EXAM: CT CERVICAL SPINE WITHOUT CONTRAST TECHNIQUE: Multidetector CT imaging of the cervical spine was performed without intravenous contrast. Multiplanar CT image reconstructions were also generated. COMPARISON:  04/21/2020 FINDINGS: Alignment: Alignment is anatomic. Skull base and vertebrae: No acute fracture. No primary bone lesion or focal pathologic process. Soft tissues and spinal canal: No prevertebral fluid or swelling. No visible canal hematoma. Disc levels: There is multilevel cervical spondylosis most pronounced from C3 through C6. Left predominant neural foraminal narrowing at C3-4, C4-5, and C5-6 as result of circumferential disc osteophyte complex and uncovertebral hypertrophy. Upper chest: Airway is patent.  Lung apices are clear.  Other: Reconstructed images demonstrate no additional findings. IMPRESSION: 1. Prominent spondylosis from C3-4 through C5-6, with left predominant neural foraminal encroachment. 2. No acute cervical spine fracture. Electronically Signed  By: Sharlet Salina M.D.   On: 06/26/2020 18:25    Procedures Procedures   Medications Ordered in ED Medications  oxyCODONE-acetaminophen (PERCOCET/ROXICET) 5-325 MG per tablet 1 tablet (1 tablet Oral Given 06/26/20 1730)    ED Course  I have reviewed the triage vital signs and the nursing notes.  Pertinent labs & imaging results that were available during my care of the patient were reviewed by me and considered in my medical decision making (see chart for details).    MDM Rules/Calculators/A&P                          Danniell Rotundo is a 60 y.o. female with pertinent past medical history of neuropathy, hypertension, diabetes, chronic back pain that presents to the emergency department today for neck pain and left upper extremity pain for the past 3 weeks with recent trauma to this area.  Pain is constant, exam appears as if this is most likely originating from the neck and traveling down to her arm.  No concerns for atypical ACS.  Pain is reproducible when she moves her arm. Most likely cervical radiculopathy, however no definite weakness or sensation changes.  Will obtain CT imaging since patient did have recent trauma.  Unable to see x-rays from [facility]  No shoulder trauma.  No head trauma, no LOC.  CT neck does show some spondylosis from C3-C6 with some predominant left foraminal narrowing, most likely causing patient's symptoms.  Upon repeat exam, patient states that she feels much better with Percocet, states that she is able to finally move her shoulder and arm.  Patient to be discharged, will follow up with spine specialist, symptomatic treatment discussed in addition to pain management.  Doubt need for further emergent work up at this  time. I explained the diagnosis and have given explicit precautions to return to the ER including for any other new or worsening symptoms. The patient understands and accepts the medical plan as it's been dictated and I have answered their questions. Discharge instructions concerning home care and prescriptions have been given. The patient is STABLE and is discharged to home in good condition.  I discussed this case with my attending physician who cosigned this note including patient's presenting symptoms, physical exam, and planned diagnostics and interventions. Attending physician stated agreement with plan or made changes to plan which were implemented.  Final Clinical Impression(s) / ED Diagnoses Final diagnoses:  Spondylosis    Rx / DC Orders ED Discharge Orders         Ordered    oxyCODONE-acetaminophen (PERCOCET/ROXICET) 5-325 MG tablet  Every 8 hours PRN        06/26/20 1905           Farrel Gordon, PA-C 06/26/20 1916    Little, Ambrose Finland, MD 06/27/20 [phone]

## 2020-06-26 NOTE — ED Triage Notes (Signed)
Pt c/o neck and left UE pain x 3 weeks-states she was sent from PCP today-NAD- steady gait

## 2020-07-13 ENCOUNTER — Emergency Department (HOSPITAL_BASED_OUTPATIENT_CLINIC_OR_DEPARTMENT_OTHER)
Admission: EM | Admit: 2020-07-13 | Discharge: 2020-07-14 | Disposition: A | Discharge status: Home / Self Care | Payer: 59 | Attending: Emergency Medicine | Admitting: Emergency Medicine

## 2020-07-13 ENCOUNTER — Encounter (HOSPITAL_BASED_OUTPATIENT_CLINIC_OR_DEPARTMENT_OTHER): Payer: Self-pay | Admitting: *Deleted

## 2020-07-13 ENCOUNTER — Other Ambulatory Visit: Payer: Self-pay

## 2020-07-13 DIAGNOSIS — R739 Hyperglycemia, unspecified: Secondary | ICD-10-CM | POA: Diagnosis present

## 2020-07-13 DIAGNOSIS — E782 Mixed hyperlipidemia: Secondary | ICD-10-CM | POA: Insufficient documentation

## 2020-07-13 DIAGNOSIS — E1142 Type 2 diabetes mellitus with diabetic polyneuropathy: Secondary | ICD-10-CM | POA: Insufficient documentation

## 2020-07-13 DIAGNOSIS — I10 Essential (primary) hypertension: Secondary | ICD-10-CM | POA: Insufficient documentation

## 2020-07-13 DIAGNOSIS — E1169 Type 2 diabetes mellitus with other specified complication: Secondary | ICD-10-CM | POA: Insufficient documentation

## 2020-07-13 DIAGNOSIS — Z79899 Other long term (current) drug therapy: Secondary | ICD-10-CM | POA: Insufficient documentation

## 2020-07-13 DIAGNOSIS — Z794 Long term (current) use of insulin: Secondary | ICD-10-CM | POA: Insufficient documentation

## 2020-07-13 DIAGNOSIS — F17210 Nicotine dependence, cigarettes, uncomplicated: Secondary | ICD-10-CM | POA: Insufficient documentation

## 2020-07-13 DIAGNOSIS — E1165 Type 2 diabetes mellitus with hyperglycemia: Secondary | ICD-10-CM | POA: Insufficient documentation

## 2020-07-13 LAB — BASIC METABOLIC PANEL
Anion gap: 11 (ref 5–15)
BUN: 28 mg/dL — ABNORMAL HIGH (ref 6–20)
CO2: 27 mmol/L (ref 22–32)
Calcium: 9.8 mg/dL (ref 8.9–10.3)
Chloride: 98 mmol/L (ref 98–111)
Creatinine, Ser: 1.26 mg/dL — ABNORMAL HIGH (ref 0.44–1.00)
GFR, Estimated: 49 mL/min — ABNORMAL LOW (ref >60)
Glucose, Bld: 342 mg/dL — ABNORMAL HIGH (ref 70–99)
Potassium: 3.7 mmol/L (ref 3.5–5.1)
Sodium: 136 mmol/L (ref 135–145)

## 2020-07-13 LAB — CBC
HCT: 40.7 % (ref 36.0–46.0)
Hemoglobin: 13.6 g/dL (ref 12.0–15.0)
MCH: 30.5 pg (ref 26.0–34.0)
MCHC: 33.4 g/dL (ref 30.0–36.0)
MCV: 91.3 fL (ref 80.0–100.0)
Platelets: 235 10*3/uL (ref 150–400)
RBC: 4.46 MIL/uL (ref 3.87–5.11)
RDW: 12.5 % (ref 11.5–15.5)
WBC: 7.3 10*3/uL (ref 4.0–10.5)
nRBC: 0 % (ref 0.0–0.2)

## 2020-07-13 LAB — URINALYSIS, MICROSCOPIC (REFLEX)

## 2020-07-13 LAB — URINALYSIS, ROUTINE W REFLEX MICROSCOPIC
Bilirubin Urine: NEGATIVE
Glucose, UA: >=500 mg/dL — AB
Ketones, ur: NEGATIVE mg/dL
Leukocytes,Ua: NEGATIVE
Nitrite: NEGATIVE
Protein, ur: NEGATIVE mg/dL
Specific Gravity, Urine: 1.015 (ref 1.005–1.030)
pH: 6 (ref 5.0–8.0)

## 2020-07-13 LAB — CBG MONITORING, ED: Glucose-Capillary: 306 mg/dL — ABNORMAL HIGH (ref 70–99)

## 2020-07-13 MED ORDER — SODIUM CHLORIDE 0.9 % IV BOLUS
1000.0000 mL | Freq: Once | INTRAVENOUS | Status: AC
  Administered 2020-07-13: 1000 mL via INTRAVENOUS

## 2020-07-13 MED ORDER — INSULIN ASPART 100 UNIT/ML ~~LOC~~ SOLN
2.0000 [IU] | Freq: Once | SUBCUTANEOUS | Status: AC
  Administered 2020-07-13: 2 [IU] via INTRAVENOUS

## 2020-07-13 NOTE — Discharge Instructions (Addendum)
Start your Trulicity tomorrow as previously arranged. Do not take your normal dose of insulin tonight since you were given insulin here.   Monitor your blood sugars.  Return to the emergency department for any vomiting, abdominal pain, any other worsening concerning symptoms.

## 2020-07-13 NOTE — ED Triage Notes (Signed)
Pt reports blood sugar has been elevated- tonight was 560- Was high last week also and her PCP started her on new meds. States she has been having increased thirst and urination

## 2020-07-13 NOTE — ED Provider Notes (Signed)
[facility] EMERGENCY DEPARTMENT Provider Note   CSN: [id number] Arrival date & time: 07/13/20  2210     History Chief Complaint  Patient presents with   Hyperglycemia    Michelle Hess is a 60 y.o. female past med history of diabetes, hypertension who presents for evaluation of hyperglycemia.  Patient reports that she had noticed her blood sugar was running high over the last week.  She saw her primary care doctor last week who started her on Trulicity but states she has not taken it yet.  She was also started on another medication that she cannot recall the name above.  She takes Lantus at night.  She has not had her dose tonight.  He states she was at work and started feeling fatigued, tired and had her blood sugar checked and it was 540.  This prompted her to come to the emergency department.  She states she had one episode of vomiting a week ago before she saw her primary care doctor.  She has not had any since then.  She has had some increased thirst and increased urination.  She denies any abdominal pain, chest pain, difficulty breathing.  Denies any fevers.  The history is provided by the patient.       Past Medical History:  Diagnosis Date   Chronic back pain greater than 3 months duration    Diabetes (HCC)    Hypertension    Neuropathy     Patient Active Problem List   Diagnosis Date Noted   Depression with anxiety 11/07/2017   Essential hypertension 11/07/2017   Mixed hyperlipidemia 11/07/2017   Primary insomnia 11/07/2017   Tobacco dependence 11/07/2017   Uncontrolled type 2 diabetes mellitus with peripheral neuropathy (HCC) 11/07/2017   Vitamin D deficiency 11/07/2017   Left knee injury, subsequent encounter 08/12/2017   Upper respiratory tract infection     Past Surgical History:  Procedure Laterality Date   CHOLECYSTECTOMY     LEEP     SHOULDER SURGERY Right      OB History   No obstetric history on file.     Family  History  Problem Relation Age of Onset   Pancreatic cancer Mother    COPD Father     Social History   Tobacco Use   Smoking status: Current Every Day Smoker    Packs/day: 0.50    Types: Cigarettes   Smokeless tobacco: Never Used  Vaping Use   Vaping Use: Never used  Substance Use Topics   Alcohol use: No   Drug use: No    Home Medications Prior to Admission medications   Medication Sig Start Date End Date Taking? Authorizing Provider  atorvastatin (LIPITOR) 80 MG tablet Take 80 mg by mouth at bedtime. 11/11/17   [provider]  busPIRone (BUSPAR) 15 MG tablet Take 15 mg by mouth 3 (three) times daily as needed. 09/14/18   [provider]  citalopram (CELEXA) 20 MG tablet Take 20 mg by mouth daily. 11/11/17   [provider]  erythromycin ophthalmic ointment Place a 1/2 inch ribbon of ointment into the lower eyelid. 04/13/19   Ronnie Doss A, PA-C  gabapentin (NEURONTIN) 300 MG capsule Take 300 mg by mouth 3 (three) times daily.    [provider]  ibuprofen (ADVIL) 600 MG tablet Take 1 tablet (600 mg total) by mouth every 6 (six) hours as needed. 02/26/19   Jacalyn Lefevre, MD  LANTUS SOLOSTAR 100 UNIT/ML Solostar Pen Inject 25 Units into the  skin at bedtime.  03/16/19   [provider]  lidocaine (LIDODERM) 5 % Place 1 patch onto the skin daily. Remove & Discard patch within 12 hours or as directed by MD 01/23/20   Rolan Bucco, MD  lisinopril-hydrochlorothiazide (PRINZIDE,ZESTORETIC) 20-25 MG tablet Take 1 tablet by mouth daily. 10/29/17   [provider]  meclizine (ANTIVERT) 25 MG tablet Take 1 tablet (25 mg total) by mouth 3 (three) times daily as needed for dizziness. 04/22/20   Horton, Mayer Masker, MD  meloxicam (MOBIC) 15 MG tablet TAKE 1 TABLET BY MOUTH EVERY DAY 05/25/19   Hudnall, Azucena Fallen, MD  metoprolol tartrate (LOPRESSOR) 25 MG tablet SMARTSIG:.5 Tablet(s) By Mouth Twice Daily 04/30/19   [provider]   ondansetron (ZOFRAN) 4 MG tablet Take 1 tablet (4 mg total) by mouth every 6 (six) hours. 03/05/19   Law, Waylan Boga, PA-C  oxyCODONE-acetaminophen (PERCOCET/ROXICET) 5-325 MG tablet Take 1 tablet by mouth every 8 (eight) hours as needed for severe pain. 06/26/20   Farrel Gordon, PA-C  potassium chloride SA (K-DUR) 20 MEQ tablet Take 1 tablet (20 mEq total) by mouth 2 (two) times daily for 2 days. 10/11/18 10/13/18  Alvira Monday, MD    Allergies    Patient has no known allergies.  Review of Systems   Review of Systems  Constitutional: Positive for fatigue. Negative for fever.  Respiratory: Negative for cough and shortness of breath.   Cardiovascular: Negative for chest pain.  Gastrointestinal: Negative for abdominal pain, nausea and vomiting.  Genitourinary: Negative for dysuria and hematuria.  Neurological: Negative for headaches.  All other systems reviewed and are negative.   Physical Exam Updated Vital Signs BP 112/77 (BP Location: Right Arm)    Pulse (!) 104    Temp 98.3 F (36.8 C) (Oral)    Resp 20    Ht 5\' 4"  (1.626 m)    Wt 84.8 kg    SpO2 100%    BMI 32.10 kg/m   Physical Exam Vitals and nursing note reviewed.  Constitutional:      Appearance: Normal appearance. She is well-developed.  HENT:     Head: Normocephalic and atraumatic.  Eyes:     General: Lids are normal.     Conjunctiva/sclera: Conjunctivae normal.     Pupils: Pupils are equal, round, and reactive to light.  Cardiovascular:     Rate and Rhythm: Normal rate and regular rhythm.     Pulses: Normal pulses.     Heart sounds: Normal heart sounds. No murmur heard. No friction rub. No gallop.   Pulmonary:     Effort: Pulmonary effort is normal.     Breath sounds: Normal breath sounds.     Comments: Lungs clear to auscultation bilaterally.  Symmetric chest rise.  No wheezing, rales, rhonchi. Abdominal:     Palpations: Abdomen is soft. Abdomen is not rigid.     Tenderness: There is no abdominal  tenderness. There is no guarding.     Comments: Abdomen is soft, non-distended, non-tender. No rigidity, No guarding. No peritoneal signs.  Musculoskeletal:        General: Normal range of motion.     Cervical back: Full passive range of motion without pain.  Skin:    General: Skin is warm and dry.     Capillary Refill: Capillary refill takes less than 2 seconds.  Neurological:     Mental Status: She is alert and oriented to person, place, and time.     Comments: Follows commands,  Moves all extremities  5/5 strength to BUE and BLE  Sensation intact throughout all major nerve distributions  Psychiatric:        Speech: Speech normal.     ED Results / Procedures / Treatments   Labs (all labs ordered are listed, but only abnormal results are displayed) Labs Reviewed  BASIC METABOLIC PANEL - Abnormal; Notable for the following components:      Result Value   Glucose, Bld 342 (*)    BUN 28 (*)    Creatinine, Ser 1.26 (*)    GFR, Estimated 49 (*)    All other components within normal limits  URINALYSIS, ROUTINE W REFLEX MICROSCOPIC - Abnormal; Notable for the following components:   Glucose, UA >=500 (*)    Hgb urine dipstick TRACE (*)    All other components within normal limits  URINALYSIS, MICROSCOPIC (REFLEX) - Abnormal; Notable for the following components:   Bacteria, UA RARE (*)    All other components within normal limits  CBG MONITORING, ED - Abnormal; Notable for the following components:   Glucose-Capillary 306 (*)    All other components within normal limits  CBC  CBG MONITORING, ED  CBG MONITORING, ED    EKG None  Radiology No results found.  Procedures Procedures   Medications Ordered in ED Medications  sodium chloride 0.9 % bolus 1,000 mL (1,000 mLs Intravenous New Bag/Given 07/13/20 2300)  insulin aspart (novoLOG) injection 2 Units (2 Units Intravenous Given 07/13/20 2258)    ED Course  I have reviewed the triage vital signs and the nursing  notes.  Pertinent labs & imaging results that were available during my care of the patient were reviewed by me and considered in my medical decision making (see chart for details).    MDM Rules/Calculators/A&P                          60 year old female past history of diabetes who presents for evaluation of hyperglycemia.  She reports that she was at work today and felt tired, fatigued.  Checked her blood sugar and found to be elevated at 500.  She reports he had an episode about 500 last week ago and saw her PCP who started her on Trulicity which she has not taken yet and another medication.  She does report some increased urination, increased thirst.  On initial arrival, she is afebrile, nontoxic-appearing.  Vital signs are stable.  She is slightly tachycardic but vitals otherwise stable.  Concern for hyperglycemia versus DKA.  We will plan to check labs, give fluids.  CBC shows no leukocytosis.  CBG is 306.  UA shows no evidence of ketones. BMP shows BUN of 28 and cr of 1.26. Bicarb is 27. Anion gap is 11. Workup is non consistent with DKA.  Will give her fluids.   Portions of this note were generated with Scientist, clinical (histocompatibility and immunogenetics). Dictation errors may occur despite best attempts at proofreading.   Final Clinical Impression(s) / ED Diagnoses Final diagnoses:  Hyperglycemia    Rx / DC Orders ED Discharge Orders    None       Rosana Hoes 07/14/20 0004    Sabino Donovan, MD 07/14/20 0021

## 2020-07-14 LAB — CBG MONITORING, ED: Glucose-Capillary: 223 mg/dL — ABNORMAL HIGH (ref 70–99)

## 2020-08-05 ENCOUNTER — Encounter: Payer: 59 | Admitting: Registered"

## 2020-08-07 ENCOUNTER — Ambulatory Visit: Payer: 59 | Admitting: Registered"

## 2020-09-11 ENCOUNTER — Other Ambulatory Visit: Payer: Self-pay

## 2020-09-11 ENCOUNTER — Encounter (HOSPITAL_BASED_OUTPATIENT_CLINIC_OR_DEPARTMENT_OTHER): Payer: Self-pay | Admitting: *Deleted

## 2020-09-11 ENCOUNTER — Emergency Department (HOSPITAL_BASED_OUTPATIENT_CLINIC_OR_DEPARTMENT_OTHER): Payer: 59

## 2020-09-11 ENCOUNTER — Emergency Department (HOSPITAL_BASED_OUTPATIENT_CLINIC_OR_DEPARTMENT_OTHER)
Admission: EM | Admit: 2020-09-11 | Discharge: 2020-09-12 | Disposition: A | Discharge status: Home / Self Care | Payer: 59 | Attending: Emergency Medicine | Admitting: Emergency Medicine

## 2020-09-11 DIAGNOSIS — Z79899 Other long term (current) drug therapy: Secondary | ICD-10-CM | POA: Diagnosis not present

## 2020-09-11 DIAGNOSIS — Z20822 Contact with and (suspected) exposure to covid-19: Secondary | ICD-10-CM | POA: Diagnosis not present

## 2020-09-11 DIAGNOSIS — F17210 Nicotine dependence, cigarettes, uncomplicated: Secondary | ICD-10-CM | POA: Insufficient documentation

## 2020-09-11 DIAGNOSIS — E1140 Type 2 diabetes mellitus with diabetic neuropathy, unspecified: Secondary | ICD-10-CM | POA: Diagnosis not present

## 2020-09-11 DIAGNOSIS — R079 Chest pain, unspecified: Secondary | ICD-10-CM | POA: Diagnosis present

## 2020-09-11 DIAGNOSIS — I10 Essential (primary) hypertension: Secondary | ICD-10-CM | POA: Insufficient documentation

## 2020-09-11 DIAGNOSIS — J029 Acute pharyngitis, unspecified: Secondary | ICD-10-CM | POA: Diagnosis not present

## 2020-09-11 LAB — CBC WITH DIFFERENTIAL/PLATELET
Abs Immature Granulocytes: 0.02 10*3/uL (ref 0.00–0.07)
Basophils Absolute: 0 10*3/uL (ref 0.0–0.1)
Basophils Relative: 1 %
Eosinophils Absolute: 0.2 10*3/uL (ref 0.0–0.5)
Eosinophils Relative: 2 %
HCT: 41.6 % (ref 36.0–46.0)
Hemoglobin: 13.6 g/dL (ref 12.0–15.0)
Immature Granulocytes: 0 %
Lymphocytes Relative: 40 %
Lymphs Abs: 3.2 10*3/uL (ref 0.7–4.0)
MCH: 30.6 pg (ref 26.0–34.0)
MCHC: 32.7 g/dL (ref 30.0–36.0)
MCV: 93.5 fL (ref 80.0–100.0)
Monocytes Absolute: 0.4 10*3/uL (ref 0.1–1.0)
Monocytes Relative: 5 %
Neutro Abs: 4.2 10*3/uL (ref 1.7–7.7)
Neutrophils Relative %: 52 %
Platelets: 251 10*3/uL (ref 150–400)
RBC: 4.45 MIL/uL (ref 3.87–5.11)
RDW: 13.6 % (ref 11.5–15.5)
WBC: 8.1 10*3/uL (ref 4.0–10.5)
nRBC: 0 % (ref 0.0–0.2)

## 2020-09-11 NOTE — ED Provider Notes (Signed)
[facility] EMERGENCY DEPARTMENT Provider Note   CSN: [id number] Arrival date & time: 09/11/20  2244     History Chief Complaint  Patient presents with  . Chest Pain    Michelle Hess is a 60 y.o. female.  HPI      60yo female with history of DM, htn, hyperlipidemia, smoking, depression, back pain, who presents with concern for chest pain.   Woke up this morning with feeling of something in throat and cleared out some mucus with blood, got to work 530PM throat and chest felt like meeting together and hurting and every time swallowed felt like the pain was worse.  Person working with checked BP was 138 and pulse was 128 standing up.  Sat down and blood pressure 119 and puse still 110 and chest still hurting and 10PM decided to come in  Because of continued pain.  Chest pain felt like pressure, feeling like it coming back up throat, burns with coughing, throat hurts now, cough it burns. Granddaughter and grandson have bad colds and taking care of them, both had fever.  Pain worsens with swallowing, pain did start at 5PM.  Nausea this AM.  No vomiting. No abdominal pain, leg pain or swelling, Not worse with exertion. Not having runny nose or cough, just mucus in back of throat, otherwise not coughing. No fever.  Does smoke cigarettes, no etoh or other drugs.   Past Medical History:  Diagnosis Date  . Chronic back pain greater than 3 months duration   . Diabetes (HCC)   . Hypertension   . Neuropathy     Patient Active Problem List   Diagnosis Date Noted  . Depression with anxiety 11/07/2017  . Essential hypertension 11/07/2017  . Mixed hyperlipidemia 11/07/2017  . Primary insomnia 11/07/2017  . Tobacco dependence 11/07/2017  . Uncontrolled type 2 diabetes mellitus with peripheral neuropathy (HCC) 11/07/2017  . Vitamin D deficiency 11/07/2017  . Left knee injury, subsequent encounter 08/12/2017  . Upper respiratory tract infection     Past Surgical History:   Procedure Laterality Date  . CHOLECYSTECTOMY    . LEEP    . SHOULDER SURGERY Right      OB History   No obstetric history on file.     Family History  Problem Relation Age of Onset  . Pancreatic cancer Mother   . COPD Father     Social History   Tobacco Use  . Smoking status: Current Every Day Smoker    Packs/day: 0.50    Types: Cigarettes  . Smokeless tobacco: Never Used  Vaping Use  . Vaping Use: Never used  Substance Use Topics  . Alcohol use: No  . Drug use: No    Home Medications Prior to Admission medications   Medication Sig Start Date End Date Taking? Authorizing Provider  pantoprazole (PROTONIX) 20 MG tablet Take 2 tablets (40 mg total) by mouth daily for 14 days. 09/12/20 09/26/20 Yes Alvira MondaySchlossman, Renarda Mullinix, MD  atorvastatin (LIPITOR) 80 MG tablet Take 80 mg by mouth at bedtime. 11/11/17   [provider]  busPIRone (BUSPAR) 15 MG tablet Take 15 mg by mouth 3 (three) times daily as needed. 09/14/18   [provider]  citalopram (CELEXA) 20 MG tablet Take 20 mg by mouth daily. 11/11/17   [provider]  erythromycin ophthalmic ointment Place a 1/2 inch ribbon of ointment into the lower eyelid. 04/13/19   Ronnie DossMcLean, Kelly A, PA-C  gabapentin (NEURONTIN) 300 MG capsule Take 300 mg by mouth  3 (three) times daily.    [provider]  ibuprofen (ADVIL) 600 MG tablet Take 1 tablet (600 mg total) by mouth every 6 (six) hours as needed. 02/26/19   Jacalyn Lefevre, MD  LANTUS SOLOSTAR 100 UNIT/ML Solostar Pen Inject 25 Units into the skin at bedtime.  03/16/19   [provider]  lidocaine (LIDODERM) 5 % Place 1 patch onto the skin daily. Remove & Discard patch within 12 hours or as directed by MD 01/23/20   Rolan Bucco, MD  lisinopril-hydrochlorothiazide (PRINZIDE,ZESTORETIC) 20-25 MG tablet Take 1 tablet by mouth daily. 10/29/17   [provider]  meclizine (ANTIVERT) 25 MG tablet Take 1 tablet (25 mg total) by mouth 3 (three)  times daily as needed for dizziness. 04/22/20   Horton, Mayer Masker, MD  meloxicam (MOBIC) 15 MG tablet TAKE 1 TABLET BY MOUTH EVERY DAY 05/25/19   Hudnall, Azucena Fallen, MD  metoprolol tartrate (LOPRESSOR) 25 MG tablet SMARTSIG:.5 Tablet(s) By Mouth Twice Daily 04/30/19   [provider]  ondansetron (ZOFRAN) 4 MG tablet Take 1 tablet (4 mg total) by mouth every 6 (six) hours. 03/05/19   Law, Waylan Boga, PA-C  oxyCODONE-acetaminophen (PERCOCET/ROXICET) 5-325 MG tablet Take 1 tablet by mouth every 8 (eight) hours as needed for severe pain. 06/26/20   Farrel Gordon, PA-C  potassium chloride SA (K-DUR) 20 MEQ tablet Take 1 tablet (20 mEq total) by mouth 2 (two) times daily for 2 days. 10/11/18 10/13/18  Alvira Monday, MD    Allergies    Patient has no known allergies.  Review of Systems   Review of Systems  Constitutional: Negative for fever.  HENT: Negative for sore throat.   Eyes: Negative for visual disturbance.  Respiratory: Negative for cough and shortness of breath.   Cardiovascular: Negative for chest pain.  Gastrointestinal: Negative for abdominal pain.  Genitourinary: Negative for difficulty urinating.  Musculoskeletal: Negative for back pain and neck pain.  Skin: Negative for rash.  Neurological: Negative for syncope and headaches.    Physical Exam Updated Vital Signs BP (!) 130/95 (BP Location: Right Arm)   Pulse 87   Temp 99.1 F (37.3 C) (Oral)   Resp 18   Ht 5\' 4"  (1.626 m)   Wt 81.6 kg   SpO2 100%   BMI 30.90 kg/m   Physical Exam Vitals and nursing note reviewed.  Constitutional:      General: She is not in acute distress.    Appearance: She is well-developed. She is not diaphoretic.  HENT:     Head: Normocephalic and atraumatic.  Eyes:     Conjunctiva/sclera: Conjunctivae normal.  Cardiovascular:     Rate and Rhythm: Normal rate and regular rhythm.     Heart sounds: Normal heart sounds. No murmur heard. No friction rub. No gallop.   Pulmonary:      Effort: Pulmonary effort is normal. No respiratory distress.     Breath sounds: Normal breath sounds. No wheezing or rales.  Abdominal:     General: There is no distension.     Palpations: Abdomen is soft.     Tenderness: There is no abdominal tenderness. There is no guarding.  Musculoskeletal:        General: No tenderness.     Cervical back: Normal range of motion.  Skin:    General: Skin is warm and dry.     Findings: No erythema or rash.  Neurological:     Mental Status: She is alert and oriented to person, place, and  time.     ED Results / Procedures / Treatments   Labs (all labs ordered are listed, but only abnormal results are displayed) Labs Reviewed  COMPREHENSIVE METABOLIC PANEL - Abnormal; Notable for the following components:      Result Value   Potassium 3.3 (*)    Glucose, Bld 201 (*)    BUN 23 (*)    Creatinine, Ser 1.26 (*)    Total Protein 8.2 (*)    GFR, Estimated 49 (*)    All other components within normal limits  GROUP A STREP BY PCR  SARS CORONAVIRUS 2 (TAT 6-24 HRS)  CBC WITH DIFFERENTIAL/PLATELET  TROPONIN I (HIGH SENSITIVITY)  TROPONIN I (HIGH SENSITIVITY)    EKG EKG Interpretation  Date/Time:  Thursday Sep 11 2020 23:01:17 EDT Ventricular Rate:  95 PR Interval:  176 QRS Duration: 87 QT Interval:  382 QTC Calculation: 481 R Axis:   -64 Text Interpretation: Sinus tachycardia Multiple ventricular premature complexes Left anterior fascicular block Probable anterior infarct, age indeterminate No significant change since last tracing Confirmed by Alvira Monday ([id number]) on 09/11/2020 11:05:46 PM   Radiology DG Chest 2 View  Result Date: 09/11/2020 CLINICAL DATA:  Chest pain. EXAM: CHEST - 2 VIEW COMPARISON:  Chest radiograph 10/23/2019 FINDINGS: The cardiomediastinal contours are normal. Chronic peribronchial thickening. Trace fluid in the right minor fissure unchanged. Pulmonary vasculature is normal. No consolidation, pleural effusion, or  pneumothorax. No acute osseous abnormalities are seen. IMPRESSION: Chronic peribronchial thickening without acute abnormality. Electronically Signed   By: Narda Rutherford M.D.   On: 09/11/2020 23:46    Procedures Procedures   Medications Ordered in ED Medications  alum & mag hydroxide-simeth (MAALOX/MYLANTA) 200-200-20 MG/5ML suspension 30 mL (30 mLs Oral Given 09/12/20 0129)    And  lidocaine (XYLOCAINE) 2 % viscous mouth solution 15 mL (15 mLs Oral Given 09/12/20 0130)    ED Course  I have reviewed the triage vital signs and the nursing notes.  Pertinent labs & imaging results that were available during my care of the patient were reviewed by me and considered in my medical decision making (see chart for details).    MDM Rules/Calculators/A&P                          60yo female with history of DM, htn, hyperlipidemia, smoking, depression, back pain, who presents with concern for chest pain, also reports some sore throat and pain with swallowing.   Differential diagnosis for chest pain includes pulmonary embolus, dissection, pneumothorax, pneumonia, ACS, myocarditis, pericarditis, GERD, esophageal rupture.  EKG was done and evaluate by me and showed no acute ST changes and no signs of pericarditis. Chest x-ray was done and evaluated by me and radiology and showed no sign of pneumonia or pneumothorax. She does not have dyspnea and have low suspicion for PE.  History and exam not consistent with dissection or esophageal rupture.   Delta troponin negative. Does have risk factors but story atypical with worsening with swallowing and also describes other symptoms such as sore throat.  Consider GERD, esophageal etiology or viral etiology in setting of sick grandkids.  Strep testing negative. COVID testing pending. Recommend supportive care, PPI, outpt follow up. Given GI cocktail/lidocaine-she reports she is chest pain free. Patient discharged in stable condition with understanding of reasons to  return.   Final Clinical Impression(s) / ED Diagnoses Final diagnoses:  Nonspecific chest pain  Sore throat    Rx / DC Orders  ED Discharge Orders         Ordered    pantoprazole (PROTONIX) 20 MG tablet  Daily        09/12/20 0153           Alvira Monday, MD 09/12/20 [phone]

## 2020-09-11 NOTE — ED Triage Notes (Addendum)
Pt reports anterior neck pain-increased pain with swallowing radiating down into her substernal chest x 5 hours. Denies cough, denies fever.  Reports nausea, denies vomiting.

## 2020-09-12 LAB — COMPREHENSIVE METABOLIC PANEL
ALT: 35 U/L (ref 0–44)
AST: 21 U/L (ref 15–41)
Albumin: 4.1 g/dL (ref 3.5–5.0)
Alkaline Phosphatase: 116 U/L (ref 38–126)
Anion gap: 11 (ref 5–15)
BUN: 23 mg/dL — ABNORMAL HIGH (ref 6–20)
CO2: 28 mmol/L (ref 22–32)
Calcium: 9.6 mg/dL (ref 8.9–10.3)
Chloride: 100 mmol/L (ref 98–111)
Creatinine, Ser: 1.26 mg/dL — ABNORMAL HIGH (ref 0.44–1.00)
GFR, Estimated: 49 mL/min — ABNORMAL LOW (ref >60)
Glucose, Bld: 201 mg/dL — ABNORMAL HIGH (ref 70–99)
Potassium: 3.3 mmol/L — ABNORMAL LOW (ref 3.5–5.1)
Sodium: 139 mmol/L (ref 135–145)
Total Bilirubin: 0.3 mg/dL (ref 0.3–1.2)
Total Protein: 8.2 g/dL — ABNORMAL HIGH (ref 6.5–8.1)

## 2020-09-12 LAB — TROPONIN I (HIGH SENSITIVITY)
Troponin I (High Sensitivity): 5 ng/L (ref <18)
Troponin I (High Sensitivity): 5 ng/L (ref <18)

## 2020-09-12 LAB — GROUP A STREP BY PCR: Group A Strep by PCR: NOT DETECTED

## 2020-09-12 LAB — SARS CORONAVIRUS 2 (TAT 6-24 HRS): SARS Coronavirus 2: NEGATIVE

## 2020-09-12 MED ORDER — PANTOPRAZOLE SODIUM 20 MG PO TBEC: 40.0000 mg | DELAYED_RELEASE_TABLET | Freq: Every day | ORAL | 0 refills | Status: AC

## 2020-09-12 MED ORDER — ALUM & MAG HYDROXIDE-SIMETH 200-200-20 MG/5ML PO SUSP
30.0000 mL | Freq: Once | ORAL | Status: AC
  Administered 2020-09-12: 30 mL via ORAL
  Filled 2020-09-12: qty 30

## 2020-09-12 MED ORDER — LIDOCAINE VISCOUS HCL 2 % MT SOLN
15.0000 mL | Freq: Once | OROMUCOSAL | Status: AC
  Administered 2020-09-12: 15 mL via ORAL
  Filled 2020-09-12: qty 15

## 2020-12-05 ENCOUNTER — Other Ambulatory Visit: Payer: Self-pay

## 2020-12-05 ENCOUNTER — Emergency Department (HOSPITAL_BASED_OUTPATIENT_CLINIC_OR_DEPARTMENT_OTHER)
Admission: EM | Admit: 2020-12-05 | Discharge: 2020-12-05 | Disposition: A | Discharge status: Home / Self Care | Payer: 59 | Attending: Emergency Medicine | Admitting: Emergency Medicine

## 2020-12-05 ENCOUNTER — Encounter (HOSPITAL_BASED_OUTPATIENT_CLINIC_OR_DEPARTMENT_OTHER): Payer: Self-pay | Admitting: *Deleted

## 2020-12-05 DIAGNOSIS — F17210 Nicotine dependence, cigarettes, uncomplicated: Secondary | ICD-10-CM | POA: Insufficient documentation

## 2020-12-05 DIAGNOSIS — R112 Nausea with vomiting, unspecified: Secondary | ICD-10-CM | POA: Insufficient documentation

## 2020-12-05 DIAGNOSIS — Z20822 Contact with and (suspected) exposure to covid-19: Secondary | ICD-10-CM | POA: Diagnosis not present

## 2020-12-05 DIAGNOSIS — I10 Essential (primary) hypertension: Secondary | ICD-10-CM | POA: Insufficient documentation

## 2020-12-05 DIAGNOSIS — M7918 Myalgia, other site: Secondary | ICD-10-CM | POA: Diagnosis not present

## 2020-12-05 DIAGNOSIS — Z79899 Other long term (current) drug therapy: Secondary | ICD-10-CM | POA: Diagnosis not present

## 2020-12-05 DIAGNOSIS — E1142 Type 2 diabetes mellitus with diabetic polyneuropathy: Secondary | ICD-10-CM | POA: Diagnosis not present

## 2020-12-05 DIAGNOSIS — Z794 Long term (current) use of insulin: Secondary | ICD-10-CM | POA: Insufficient documentation

## 2020-12-05 LAB — CBC WITH DIFFERENTIAL/PLATELET
Abs Immature Granulocytes: 0.03 10*3/uL (ref 0.00–0.07)
Basophils Absolute: 0 10*3/uL (ref 0.0–0.1)
Basophils Relative: 0 %
Eosinophils Absolute: 0.1 10*3/uL (ref 0.0–0.5)
Eosinophils Relative: 1 %
HCT: 45.1 % (ref 36.0–46.0)
Hemoglobin: 15.1 g/dL — ABNORMAL HIGH (ref 12.0–15.0)
Immature Granulocytes: 0 %
Lymphocytes Relative: 36 %
Lymphs Abs: 2.9 10*3/uL (ref 0.7–4.0)
MCH: 30.7 pg (ref 26.0–34.0)
MCHC: 33.5 g/dL (ref 30.0–36.0)
MCV: 91.7 fL (ref 80.0–100.0)
Monocytes Absolute: 0.3 10*3/uL (ref 0.1–1.0)
Monocytes Relative: 4 %
Neutro Abs: 4.9 10*3/uL (ref 1.7–7.7)
Neutrophils Relative %: 59 %
Platelets: 310 10*3/uL (ref 150–400)
RBC: 4.92 MIL/uL (ref 3.87–5.11)
RDW: 13.2 % (ref 11.5–15.5)
WBC: 8.2 10*3/uL (ref 4.0–10.5)
nRBC: 0 % (ref 0.0–0.2)

## 2020-12-05 LAB — COMPREHENSIVE METABOLIC PANEL
ALT: 10 U/L (ref 0–44)
AST: 13 U/L — ABNORMAL LOW (ref 15–41)
Albumin: 4.1 g/dL (ref 3.5–5.0)
Alkaline Phosphatase: 109 U/L (ref 38–126)
Anion gap: 9 (ref 5–15)
BUN: 22 mg/dL — ABNORMAL HIGH (ref 6–20)
CO2: 29 mmol/L (ref 22–32)
Calcium: 9.7 mg/dL (ref 8.9–10.3)
Chloride: 99 mmol/L (ref 98–111)
Creatinine, Ser: 0.97 mg/dL (ref 0.44–1.00)
GFR, Estimated: >60 mL/min (ref >60)
Glucose, Bld: 171 mg/dL — ABNORMAL HIGH (ref 70–99)
Potassium: 3.7 mmol/L (ref 3.5–5.1)
Sodium: 137 mmol/L (ref 135–145)
Total Bilirubin: 0.7 mg/dL (ref 0.3–1.2)
Total Protein: 7.1 g/dL (ref 6.5–8.1)

## 2020-12-05 LAB — LIPASE, BLOOD: Lipase: 22 U/L (ref 11–51)

## 2020-12-05 LAB — RESP PANEL BY RT-PCR (FLU A&B, COVID) ARPGX2
Influenza A by PCR: NEGATIVE
Influenza B by PCR: NEGATIVE
SARS Coronavirus 2 by RT PCR: NEGATIVE

## 2020-12-05 MED ORDER — ONDANSETRON 4 MG PO TBDP: 4.0000 mg | ORAL_TABLET | Freq: Three times a day (TID) | ORAL | 0 refills | Status: DC | PRN

## 2020-12-05 MED ORDER — ONDANSETRON HCL 4 MG/2ML IJ SOLN
4.0000 mg | Freq: Once | INTRAMUSCULAR | Status: AC
  Administered 2020-12-05: 4 mg via INTRAVENOUS
  Filled 2020-12-05: qty 2

## 2020-12-05 MED ORDER — SODIUM CHLORIDE 0.9 % IV BOLUS
1000.0000 mL | Freq: Once | INTRAVENOUS | Status: AC
  Administered 2020-12-05: 1000 mL via INTRAVENOUS

## 2020-12-05 NOTE — Discharge Instructions (Addendum)
Return for new or worsening symptoms

## 2020-12-05 NOTE — ED Provider Notes (Signed)
[facility] EMERGENCY DEPARTMENT Provider Note   CSN: [id number] Arrival date & time: 12/05/20  1212     History Chief Complaint  Patient presents with   Generalized Body Aches    Michelle Hess is a 60 y.o. female here for evaluation of feeling unwell. Began yesterday evening. 2 episodes of NBNB emesis over night, last at 0500. Has generalized myalgias. States has chronic pain at baseline, was able to keep down home meds this AM. No fever, chills, HA, weakness, numbness, CP, SOB, cough abd pain, dysuria, diarrhea. Last BM just PTA without melena or BRPBR. No sick contacts. Has not checked he blood sugar at home today. No additional aggravating or alleviating factors.   History obtained from patient and past medical records. No interpretor was used.  HPI     Past Medical History:  Diagnosis Date   Chronic back pain greater than 3 months duration    Diabetes (HCC)    Hypertension    Neuropathy     Patient Active Problem List   Diagnosis Date Noted   Depression with anxiety 11/07/2017   Essential hypertension 11/07/2017   Mixed hyperlipidemia 11/07/2017   Primary insomnia 11/07/2017   Tobacco dependence 11/07/2017   Uncontrolled type 2 diabetes mellitus with peripheral neuropathy (HCC) 11/07/2017   Vitamin D deficiency 11/07/2017   Left knee injury, subsequent encounter 08/12/2017   Upper respiratory tract infection     Past Surgical History:  Procedure Laterality Date   CHOLECYSTECTOMY     LEEP     SHOULDER SURGERY Right      OB History   No obstetric history on file.     Family History  Problem Relation Age of Onset   Pancreatic cancer Mother    COPD Father     Social History   Tobacco Use   Smoking status: Every Day    Packs/day: 0.50    Types: Cigarettes   Smokeless tobacco: Never  Vaping Use   Vaping Use: Never used  Substance Use Topics   Alcohol use: No   Drug use: No    Home Medications Prior to Admission medications    Medication Sig Start Date End Date Taking? Authorizing Provider  busPIRone (BUSPAR) 15 MG tablet Take 15 mg by mouth 3 (three) times daily as needed. 09/14/18  Yes [provider]  citalopram (CELEXA) 20 MG tablet Take 20 mg by mouth daily. 11/11/17  Yes [provider]  gabapentin (NEURONTIN) 300 MG capsule Take 300 mg by mouth 3 (three) times daily.   Yes [provider]  ibuprofen (ADVIL) 600 MG tablet Take 1 tablet (600 mg total) by mouth every 6 (six) hours as needed. 02/26/19  Yes Jacalyn Lefevre, MD  LANTUS SOLOSTAR 100 UNIT/ML Solostar Pen Inject 25 Units into the skin at bedtime.  03/16/19  Yes [provider]  lisinopril-hydrochlorothiazide (PRINZIDE,ZESTORETIC) 20-25 MG tablet Take 1 tablet by mouth daily. 10/29/17  Yes [provider]  ondansetron (ZOFRAN ODT) 4 MG disintegrating tablet Take 1 tablet (4 mg total) by mouth every 8 (eight) hours as needed for nausea or vomiting. 12/05/20  Yes Annalyse Langlais A, PA-C  oxyCODONE-acetaminophen (PERCOCET/ROXICET) 5-325 MG tablet Take 1 tablet by mouth every 8 (eight) hours as needed for severe pain. 06/26/20  Yes Farrel Gordon, PA-C  atorvastatin (LIPITOR) 80 MG tablet Take 80 mg by mouth at bedtime. 11/11/17   [provider]  erythromycin ophthalmic ointment Place a 1/2 inch ribbon of ointment into the lower eyelid. 04/13/19  Ronnie Doss A, PA-C  lidocaine (LIDODERM) 5 % Place 1 patch onto the skin daily. Remove & Discard patch within 12 hours or as directed by MD 01/23/20   Rolan Bucco, MD  meclizine (ANTIVERT) 25 MG tablet Take 1 tablet (25 mg total) by mouth 3 (three) times daily as needed for dizziness. 04/22/20   Horton, Mayer Masker, MD  meloxicam (MOBIC) 15 MG tablet TAKE 1 TABLET BY MOUTH EVERY DAY 05/25/19   Hudnall, Azucena Fallen, MD  metoprolol tartrate (LOPRESSOR) 25 MG tablet SMARTSIG:.5 Tablet(s) By Mouth Twice Daily 04/30/19   [provider]  ondansetron (ZOFRAN) 4 MG  tablet Take 1 tablet (4 mg total) by mouth every 6 (six) hours. 03/05/19   Law, Waylan Boga, PA-C  pantoprazole (PROTONIX) 20 MG tablet Take 2 tablets (40 mg total) by mouth daily for 14 days. 09/12/20 09/26/20  Alvira Monday, MD  potassium chloride SA (K-DUR) 20 MEQ tablet Take 1 tablet (20 mEq total) by mouth 2 (two) times daily for 2 days. 10/11/18 10/13/18  Alvira Monday, MD    Allergies    Patient has no known allergies.  Review of Systems   Review of Systems  Constitutional:  Positive for fatigue. Negative for activity change, appetite change, chills, diaphoresis, fever and unexpected weight change.  HENT: Negative.    Respiratory: Negative.    Cardiovascular: Negative.   Gastrointestinal:  Positive for nausea and vomiting. Negative for abdominal distention, abdominal pain, anal bleeding, blood in stool, constipation, diarrhea and rectal pain.  Genitourinary: Negative.   Musculoskeletal:  Positive for myalgias (Chronic pain). Negative for arthralgias, back pain, gait problem, joint swelling, neck pain and neck stiffness.  Skin: Negative.   Neurological: Negative.   All other systems reviewed and are negative.  Physical Exam Updated Vital Signs BP 99/60   Pulse 79   Temp 98.7 F (37.1 C) (Oral)   Resp 20   Ht 5\' 4"  (1.626 m)   Wt 81.6 kg   SpO2 96%   BMI 30.88 kg/m   Physical Exam Vitals and nursing note reviewed.  Constitutional:      General: She is not in acute distress.    Appearance: She is well-developed. She is not ill-appearing, toxic-appearing or diaphoretic.  HENT:     Head: Normocephalic and atraumatic.     Right Ear: Tympanic membrane, ear canal and external ear normal. There is no impacted cerumen.     Left Ear: Tympanic membrane, ear canal and external ear normal. There is no impacted cerumen.     Nose: Nose normal.     Mouth/Throat:     Mouth: Mucous membranes are moist.  Eyes:     Pupils: Pupils are equal, round, and reactive to light.   Cardiovascular:     Rate and Rhythm: Normal rate.     Pulses: Normal pulses.     Heart sounds: Normal heart sounds.  Pulmonary:     Effort: Pulmonary effort is normal. No respiratory distress.     Breath sounds: Normal breath sounds.  Abdominal:     General: Bowel sounds are normal. There is no distension.     Palpations: Abdomen is soft. There is no mass.     Tenderness: There is no abdominal tenderness. There is no right CVA tenderness, left CVA tenderness, guarding or rebound.  Musculoskeletal:        General: No swelling, tenderness, deformity or signs of injury. Normal range of motion.     Cervical back: Normal range of motion and  neck supple.     Right lower leg: No edema.     Left lower leg: No edema.  Skin:    General: Skin is warm and dry.     Capillary Refill: Capillary refill takes less than 2 seconds.  Neurological:     General: No focal deficit present.     Mental Status: She is alert and oriented to person, place, and time.  Psychiatric:        Mood and Affect: Mood normal.     ED Results / Procedures / Treatments   Labs (all labs ordered are listed, but only abnormal results are displayed) Labs Reviewed  CBC WITH DIFFERENTIAL/PLATELET - Abnormal; Notable for the following components:      Result Value   Hemoglobin 15.1 (*)    All other components within normal limits  COMPREHENSIVE METABOLIC PANEL - Abnormal; Notable for the following components:   Glucose, Bld 171 (*)    BUN 22 (*)    AST 13 (*)    All other components within normal limits  RESP PANEL BY RT-PCR (FLU A&B, COVID) ARPGX2  LIPASE, BLOOD  URINALYSIS, ROUTINE W REFLEX MICROSCOPIC    EKG EKG Interpretation  Date/Time:  Friday December 05 2020 12:54:29 EDT Ventricular Rate:  80 PR Interval:  175 QRS Duration: 98 QT Interval:  402 QTC Calculation: 464 R Axis:   -65 Text Interpretation: Sinus rhythm Left anterior fascicular block Probable anterior infarct, age indeterminate Confirmed by  Virgina Norfolkuratolo, Adam [phone] on 12/05/2020 12:57:18 PM  Radiology No results found.  Procedures Procedures   Medications Ordered in ED Medications  ondansetron (ZOFRAN) injection 4 mg (4 mg Intravenous Given 12/05/20 1258)  sodium chloride 0.9 % bolus 1,000 mL (0 mLs Intravenous Stopped 12/05/20 1400)   ED Course  I have reviewed the triage vital signs and the nursing notes.  Pertinent labs & imaging results that were available during my care of the patient were reviewed by me and considered in my medical decision making (see chart for details).  Here for evaluation of feeling unwell. Afebrile, non septic non ill appearing. Heart and lungs clear. Abd soft non tender. Passing flatus, last BM this AM without melena or BRBPR. Plan on labs and reassess.  Labs personally reviewed and interpreted:  CBC without significant abnormality CMP with glucose at 171, BUN 22, AST 13 no additional aggravating or alleviating factors Lipase 22 UA patient refused COVID, Flu neg  Patient reassessed. No emesis here in ED. Patient states she has to leave. She has not given a UA yet and has not attempted PO challenge. Declines further workup here in ED at this time. Discussed risk vs benefit of leaving>>Voices understanding. States she will return if symptoms worsen. Will dc home with zofran rx and have patient return for new or worsening symptoms.  On repeat exam patient does not have a surgical abdomin and there are no peritoneal signs.  No indication of appendicitis, bowel obstruction, bowel perforation, cholecystitis, diverticulitis, AAA, dissection, atypical ACS, PE.    The patient has been appropriately medically screened and/or stabilized in the ED. I have low suspicion for any other emergent medical condition which would require further screening, evaluation or treatment in the ED or require inpatient management.  Patient is hemodynamically stable and in no acute distress.  Patient able to ambulate in department  prior to ED.  Evaluation does not show acute pathology that would require ongoing or additional emergent interventions while in the emergency department or further inpatient treatment.  I have discussed the diagnosis with the patient and answered all questions.  Pain is been managed while in the emergency department and patient has no further complaints prior to discharge.  Patient is comfortable with plan discussed in room and is stable for discharge at this time.  I have discussed strict return precautions for returning to the emergency department.  Patient was encouraged to follow-up with PCP/specialist refer to at discharge.       MDM Rules/Calculators/A&P                           Michelle Hess was evaluated in Emergency Department on 12/05/2020 for the symptoms described in the history of present illness. She was evaluated in the context of the global COVID-19 pandemic, which necessitated consideration that the patient might be at risk for infection with the SARS-CoV-2 virus that causes COVID-19. Institutional protocols and algorithms that pertain to the evaluation of patients at risk for COVID-19 are in a state of rapid change based on information released by regulatory bodies including the CDC and federal and state organizations. These policies and algorithms were followed during the patient's care in the ED.  Final Clinical Impression(s) / ED Diagnoses Final diagnoses:  Non-intractable vomiting with nausea, unspecified vomiting type    Rx / DC Orders ED Discharge Orders          Ordered    ondansetron (ZOFRAN ODT) 4 MG disintegrating tablet  Every 8 hours PRN        12/05/20 1409             Miyani Cronic A, PA-C 12/05/20 1415    Curatolo, Adam, DO 12/05/20 1455

## 2020-12-05 NOTE — ED Triage Notes (Signed)
General body aches since yesterday. Nausea since last night. Vomited x 2 since last night.

## 2022-04-14 IMAGING — DX DG CHEST 1V PORT
1 series · 1 of 1 positions shown · non-contrast
Comparison: Chest radiograph dated 04/07/2019.

CLINICAL DATA: 58-year-old female with nausea vomiting.

EXAM:
PORTABLE CHEST 1 VIEW

[chest ap]
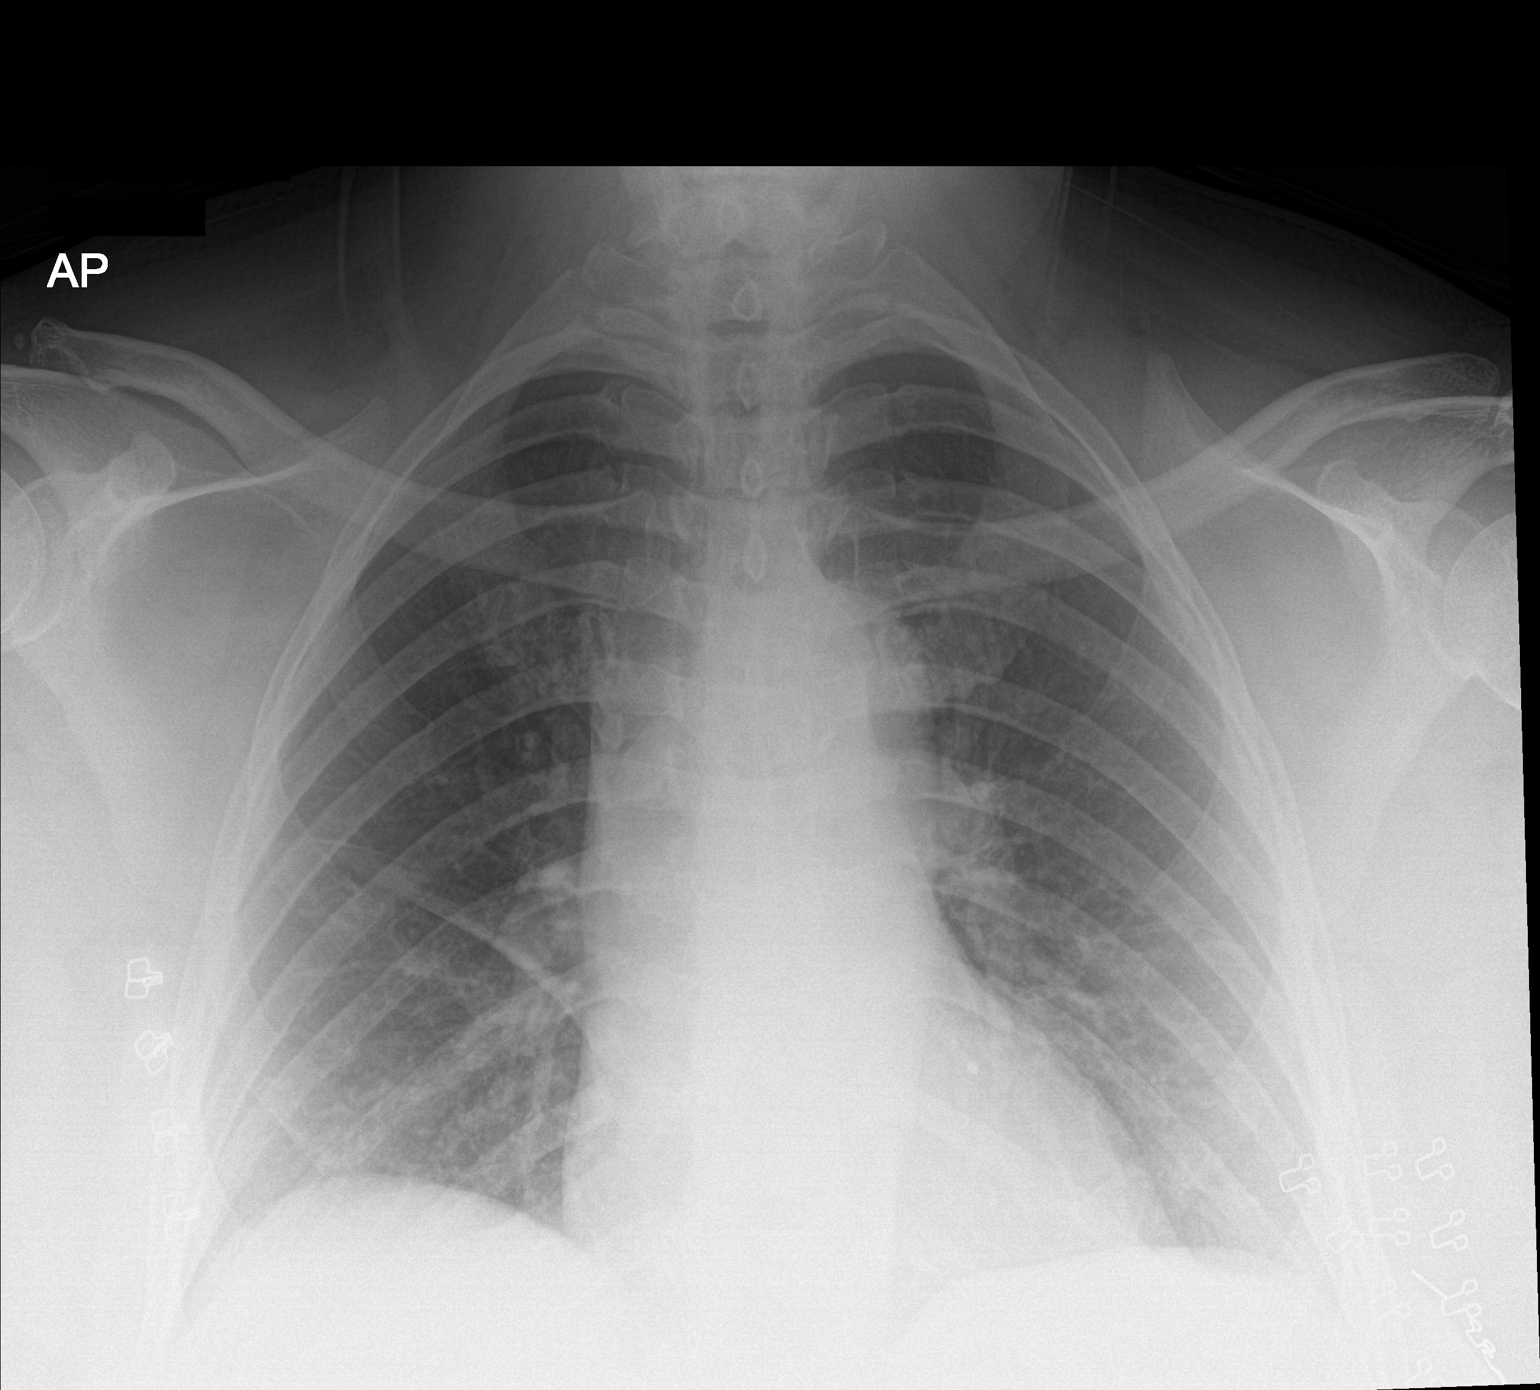

[1 of 1 positions shown; findings below may reference images not displayed]

FINDINGS: No focal consolidation, pleural effusion, pneumothorax. Probable
trace fluid along the right minor fissure. The cardiac silhouette is
within limits. No acute osseous pathology.
IMPRESSION: No acute cardiopulmonary process.

## 2023-02-17 ENCOUNTER — Encounter (HOSPITAL_BASED_OUTPATIENT_CLINIC_OR_DEPARTMENT_OTHER): Payer: Self-pay

## 2023-02-17 ENCOUNTER — Other Ambulatory Visit: Payer: Self-pay

## 2023-02-17 ENCOUNTER — Emergency Department (HOSPITAL_BASED_OUTPATIENT_CLINIC_OR_DEPARTMENT_OTHER)
Admission: EM | Admit: 2023-02-17 | Discharge: 2023-02-17 | Disposition: A | Discharge status: Home / Self Care | Payer: Commercial Managed Care - HMO | Attending: Emergency Medicine | Admitting: Emergency Medicine

## 2023-02-17 DIAGNOSIS — R519 Headache, unspecified: Secondary | ICD-10-CM | POA: Diagnosis present

## 2023-02-17 DIAGNOSIS — R197 Diarrhea, unspecified: Secondary | ICD-10-CM | POA: Insufficient documentation

## 2023-02-17 DIAGNOSIS — E119 Type 2 diabetes mellitus without complications: Secondary | ICD-10-CM | POA: Diagnosis not present

## 2023-02-17 DIAGNOSIS — R112 Nausea with vomiting, unspecified: Secondary | ICD-10-CM

## 2023-02-17 DIAGNOSIS — U071 COVID-19: Secondary | ICD-10-CM | POA: Insufficient documentation

## 2023-02-17 DIAGNOSIS — I10 Essential (primary) hypertension: Secondary | ICD-10-CM | POA: Diagnosis not present

## 2023-02-17 LAB — RESP PANEL BY RT-PCR (RSV, FLU A&B, COVID)  RVPGX2
Influenza A by PCR: NEGATIVE
Influenza B by PCR: NEGATIVE
Resp Syncytial Virus by PCR: NEGATIVE
SARS Coronavirus 2 by RT PCR: POSITIVE — AB

## 2023-02-17 MED ORDER — ONDANSETRON 4 MG PO TBDP: 4.0000 mg | ORAL_TABLET | Freq: Three times a day (TID) | ORAL | 0 refills | Status: DC | PRN

## 2023-02-17 MED ORDER — ONDANSETRON 4 MG PO TBDP
4.0000 mg | ORAL_TABLET | Freq: Once | ORAL | Status: AC
  Administered 2023-02-17: 4 mg via ORAL
  Filled 2023-02-17: qty 1

## 2023-02-17 MED ORDER — BENZONATATE 100 MG PO CAPS: 100.0000 mg | ORAL_CAPSULE | Freq: Three times a day (TID) | ORAL | 0 refills | Status: AC

## 2023-02-17 MED ORDER — LOPERAMIDE HCL 2 MG PO CAPS: 2.0000 mg | ORAL_CAPSULE | Freq: Four times a day (QID) | ORAL | 0 refills | Status: DC | PRN

## 2023-02-17 MED ORDER — KETOROLAC TROMETHAMINE 30 MG/ML IJ SOLN
30.0000 mg | Freq: Once | INTRAMUSCULAR | Status: AC
  Administered 2023-02-17: 30 mg via INTRAMUSCULAR
  Filled 2023-02-17: qty 1

## 2023-02-17 MED ORDER — PAXLOVID (300/100) 20 X 150 MG & 10 X 100MG PO TBPK: 3.0000 | ORAL_TABLET | Freq: Two times a day (BID) | ORAL | 0 refills | Status: AC

## 2023-02-17 NOTE — Discharge Instructions (Signed)
Please remain isolated for others and take the medications as prescribed. Return to the ED with any new or suddenly worsening symptoms.

## 2023-02-17 NOTE — ED Triage Notes (Signed)
Pt reports NVD, headache, dizziness, cough starting yesterday afternoon. Unable to keep food and drink down. Denies chest pain or SHOB

## 2023-02-17 NOTE — ED Provider Notes (Signed)
Emergency Department Provider Note   I have reviewed the triage vital signs and the nursing notes.   HISTORY  Chief Complaint Headache and Emesis   HPI Michelle Hess is a 62 y.o. female with past history reviewed below presents emergency department for evaluation of headache along with nausea, vomiting, diarrhea.  Symptoms been ongoing for the 4 hours.  No blood in the emesis or stool.  No recent travel or abx. No chest or abdominal pain. No fever.   Past Medical History:  Diagnosis Date   Chronic back pain greater than 3 months duration    Diabetes (HCC)    Hypertension    Neuropathy     Review of Systems  Constitutional: No fever/chills Cardiovascular: Denies chest pain. Respiratory: Denies shortness of breath. Gastrointestinal: No abdominal pain. Positive nausea, vomiting, and diarrhea.  No constipation. Genitourinary: Negative for dysuria. Musculoskeletal: Negative for back pain. Skin: Negative for rash. Neurological: Negative for headaches.  ____________________________________________   PHYSICAL EXAM:  VITAL SIGNS: ED Triage Vitals  Encounter Vitals Group     BP 02/17/23 0431 (!) 126/106     Pulse Rate 02/17/23 0431 96     Resp 02/17/23 0431 18     Temp 02/17/23 0431 98.1 F (36.7 C)     Temp Source 02/17/23 0431 Oral     SpO2 02/17/23 0431 98 %     Weight 02/17/23 0432 160 lb (72.6 kg)     Height 02/17/23 0432 5\' 4"  (1.626 m)   Constitutional: Alert and oriented. Well appearing and in no acute distress. Eyes: Conjunctivae are normal. Head: Atraumatic. Nose: No congestion/rhinnorhea. Mouth/Throat: Mucous membranes are moist.   Neck: No stridor. No meningismus.  Cardiovascular: Normal rate, regular rhythm. Good peripheral circulation. Grossly normal heart sounds.   Respiratory: Normal respiratory effort.  No retractions. Lungs CTAB. Gastrointestinal: Soft and nontender. No distention.  Musculoskeletal: No lower extremity tenderness nor edema. No  gross deformities of extremities. Neurologic:  Normal speech and language. No gross focal neurologic deficits are appreciated.  Skin:  Skin is warm, dry and intact. No rash noted.  ____________________________________________   LABS (all labs ordered are listed, but only abnormal results are displayed)  Labs Reviewed  RESP PANEL BY RT-PCR (RSV, FLU A&B, COVID)  RVPGX2 - Abnormal; Notable for the following components:      Result Value   SARS Coronavirus 2 by RT PCR POSITIVE (*)    All other components within normal limits   ____________________________________________  EKG   EKG Interpretation Date/Time:  Thursday February 17 2023 04:37:12 EDT Ventricular Rate:  92 PR Interval:  170 QRS Duration:  90 QT Interval:  417 QTC Calculation: 516 R Axis:   -81  Text Interpretation: Sinus rhythm Ventricular premature complex Left anterior fascicular block Consider right ventricular hypertrophy Borderline T abnormalities, diffuse leads Prolonged QT interval Similar to 2022 tracing Confirmed by Alona Bene [phone]) on 02/17/2023 4:57:05 AM       ____________________________________________   PROCEDURES  Procedure(s) performed:   Procedures  None  ____________________________________________   INITIAL IMPRESSION / ASSESSMENT AND PLAN / ED COURSE  Pertinent labs & imaging results that were available during my care of the patient were reviewed by me and considered in my medical decision making (see chart for details).   This patient is Presenting for Evaluation of HA, which does require a range of treatment options, and is a complaint that involves a high risk of morbidity and mortality.  The Differential Diagnoses includes but is not  exclusive to subarachnoid hemorrhage, meningitis, encephalitis, previous head trauma, cavernous venous thrombosis, muscle tension headache, glaucoma, temporal arteritis, migraine or migraine equivalent, etc.   Critical Interventions-     Medications  ondansetron (ZOFRAN-ODT) disintegrating tablet 4 mg (4 mg Oral Given 02/17/23 0449)  ketorolac (TORADOL) 30 MG/ML injection 30 mg (30 mg Intramuscular Given 02/17/23 0449)    Reassessment after intervention: HA improved.   Clinical Laboratory Tests Ordered, included COVID 19 positive.   Radiologic Tests: Considered CT head but no acute onset, maximal intensity symptoms. No other red flag signs/symptoms or neuro deficits on my exam. Defer imaging for now.    Medical Decision Making: Summary:  Patient presents emergency department with headache.  Nausea/vomiting.  Seems most consistent with viral process.  Considered neuroimaging as above but defer for now pending reassessment after symptom mgmt.   Reevaluation with update and discussion with patient. No CKD. Labs from May of this year show normal renal function. COVID positive. Discussed anti-viral medication with patient who is in agreement with taking this medication. Discussed ED return precautions and isolation precautions. Work note provided.   Patient's presentation is most consistent with acute presentation with potential threat to life or bodily function.   Disposition: discharge  ____________________________________________  FINAL CLINICAL IMPRESSION(S) / ED DIAGNOSES  Final diagnoses:  COVID-19  Nausea vomiting and diarrhea     NEW OUTPATIENT MEDICATIONS STARTED DURING THIS VISIT:  New Prescriptions   BENZONATATE (TESSALON) 100 MG CAPSULE    Take 1 capsule (100 mg total) by mouth every 8 (eight) hours.   LOPERAMIDE (IMODIUM) 2 MG CAPSULE    Take 1 capsule (2 mg total) by mouth 4 (four) times daily as needed for diarrhea or loose stools.   NIRMATRELVIR/RITONAVIR (PAXLOVID, 300/100,) 20 X 150 MG & 10 X 100MG  TBPK    Take 3 tablets by mouth 2 (two) times daily for 5 days. Patient GFR is 60. Take nirmatrelvir (150 mg) two tablets twice daily for 5 days and ritonavir (100 mg) one tablet twice daily for 5 days.    ONDANSETRON (ZOFRAN-ODT) 4 MG DISINTEGRATING TABLET    Take 1 tablet (4 mg total) by mouth every 8 (eight) hours as needed.    Note:  This document was prepared using Dragon voice recognition software and may include unintentional dictation errors.  Alona Bene, MD, [facility] Emergency [facility]    Kiah Vanalstine, Arlyss Repress, MD 02/17/23 [phone]

## 2023-05-11 ENCOUNTER — Emergency Department (HOSPITAL_BASED_OUTPATIENT_CLINIC_OR_DEPARTMENT_OTHER)
Admission: EM | Admit: 2023-05-11 | Discharge: 2023-05-11 | Disposition: A | Discharge status: Home / Self Care | Payer: Commercial Managed Care - HMO | Attending: Emergency Medicine | Admitting: Emergency Medicine

## 2023-05-11 ENCOUNTER — Other Ambulatory Visit: Payer: Self-pay

## 2023-05-11 ENCOUNTER — Encounter (HOSPITAL_BASED_OUTPATIENT_CLINIC_OR_DEPARTMENT_OTHER): Payer: Self-pay | Admitting: Emergency Medicine

## 2023-05-11 DIAGNOSIS — R220 Localized swelling, mass and lump, head: Secondary | ICD-10-CM | POA: Diagnosis present

## 2023-05-11 DIAGNOSIS — Z79899 Other long term (current) drug therapy: Secondary | ICD-10-CM | POA: Insufficient documentation

## 2023-05-11 DIAGNOSIS — Z7984 Long term (current) use of oral hypoglycemic drugs: Secondary | ICD-10-CM | POA: Insufficient documentation

## 2023-05-11 DIAGNOSIS — I10 Essential (primary) hypertension: Secondary | ICD-10-CM | POA: Diagnosis not present

## 2023-05-11 DIAGNOSIS — K047 Periapical abscess without sinus: Secondary | ICD-10-CM | POA: Insufficient documentation

## 2023-05-11 DIAGNOSIS — E119 Type 2 diabetes mellitus without complications: Secondary | ICD-10-CM | POA: Diagnosis not present

## 2023-05-11 MED ORDER — OXYCODONE-ACETAMINOPHEN 5-325 MG PO TABS
1.0000 | ORAL_TABLET | Freq: Once | ORAL | Status: AC
  Administered 2023-05-11: 1 via ORAL
  Filled 2023-05-11: qty 1

## 2023-05-11 MED ORDER — AMOXICILLIN 500 MG PO CAPS: 500.0000 mg | ORAL_CAPSULE | Freq: Two times a day (BID) | ORAL | 0 refills | Status: AC

## 2023-05-11 NOTE — Discharge Instructions (Addendum)
 You have been seen today for your complaint of dental pain. Your discharge medications include amoxicillin . This is an antibiotic. You should take it as prescribed. You should take it for the entire duration of the prescription. This may cause an upset stomach. This is normal. You may take this with food. You may also eat yogurt to prevent diarrhea.  Take your home pain medication as prescribed. Follow up with: Your dentist as soon as possible Please seek immediate medical care if you develop any of the following symptoms: You have a fever or chills. Your symptoms suddenly get worse. You have a very bad headache. You have problems breathing or swallowing. You have trouble opening your mouth. You have swelling in your neck or close to your eye. At this time there does not appear to be the presence of an emergent medical condition, however there is always the potential for conditions to change. Please read and follow the below instructions.  Do not take your medicine if  develop an itchy rash, swelling in your mouth or lips, or difficulty breathing; call 911 and seek immediate emergency medical attention if this occurs.  You may review your lab tests and imaging results in their entirety on your MyChart account.  Please discuss all results of fully with your primary care provider and other specialist at your follow-up visit.  Note: Portions of this text may have been transcribed using voice recognition software. Every effort was made to ensure accuracy; however, inadvertent computerized transcription errors may still be present.

## 2023-05-11 NOTE — ED Triage Notes (Signed)
 Had dental work 4 days ago , several lower teeth extraction , obvious swelling to left face to neck . Reports severe pain . No fever .

## 2023-05-11 NOTE — ED Notes (Signed)
 Discharge paperwork reviewed entirely with patient, including follow up care. Pain was under control. The patient received instruction and coaching on their prescriptions, and all follow-up questions were answered.  Pt verbalized understanding as well as all parties involved. No questions or concerns voiced at the time of discharge. No acute distress noted.   Pt ambulated out to PVA without incident or assistance.  Pt advised they will notify their PCP immediately. and Pt advised they will seek followup care with a specialist and followup with their PCP.    Patient going to her dentist after DC

## 2023-05-11 NOTE — ED Provider Notes (Signed)
 [place] [facility] Provider Note   CSN: [id number] Arrival date & time: 05/11/23  1157     History  Chief Complaint  Patient presents with   Facial Swelling    left    Michelle Hess is a 63 y.o. female.  With a history of hypertension, diabetes presenting to the ED for evaluation of dental pain.  She had all of her left teeth removed 9 days ago at her dentist office.  She reports doing well until yesterday evening when she started to develop pain, mostly on the left side of her face.  Pain makes it difficult to speak or chills.  She denies any changes in phonation.  The pain radiates to the chin and into the upper anterior.  She denies any fevers or chills.  No nausea or vomiting.  No abdominal pain.  She has not called her dentist.  HPI     Home Medications Prior to Admission medications   Medication Sig Start Date End Date Taking? Authorizing Provider  amoxicillin  (AMOXIL ) 500 MG capsule Take 1 capsule (500 mg total) by mouth 2 (two) times daily for 7 days. 05/11/23 05/18/23 Yes Chi Woodham, Marsa HERO, PA-C  atorvastatin (LIPITOR) 80 MG tablet Take 80 mg by mouth at bedtime. 11/11/17   [provider]  benzonatate  (TESSALON ) 100 MG capsule Take 1 capsule (100 mg total) by mouth every 8 (eight) hours. 02/17/23   Long, Joshua G, MD  busPIRone (BUSPAR) 15 MG tablet Take 15 mg by mouth 3 (three) times daily as needed. 09/14/18   [provider]  citalopram (CELEXA) 20 MG tablet Take 20 mg by mouth daily. 11/11/17   [provider]  erythromycin  ophthalmic ointment Place a 1/2 inch ribbon of ointment into the lower eyelid. 04/13/19   Rolan Burnard LABOR, PA-C  gabapentin  (NEURONTIN ) 300 MG capsule Take 300 mg by mouth 3 (three) times daily.    [provider]  ibuprofen  (ADVIL ) 600 MG tablet Take 1 tablet (600 mg total) by mouth every 6 (six) hours as needed. 02/26/19   Dean Clarity, MD  LANTUS  SOLOSTAR 100 UNIT/ML  Solostar Pen Inject 25 Units into the skin at bedtime.  03/16/19   [provider]  lidocaine  (LIDODERM ) 5 % Place 1 patch onto the skin daily. Remove & Discard patch within 12 hours or as directed by MD 01/23/20   Lenor Hollering, MD  lisinopril -hydrochlorothiazide  (PRINZIDE ,ZESTORETIC ) 20-25 MG tablet Take 1 tablet by mouth daily. 10/29/17   [provider]  loperamide  (IMODIUM ) 2 MG capsule Take 1 capsule (2 mg total) by mouth 4 (four) times daily as needed for diarrhea or loose stools. 02/17/23   Long, Fonda MATSU, MD  meclizine  (ANTIVERT ) 25 MG tablet Take 1 tablet (25 mg total) by mouth 3 (three) times daily as needed for dizziness. 04/22/20   Horton, Charmaine FALCON, MD  meloxicam  (MOBIC ) 15 MG tablet TAKE 1 TABLET BY MOUTH EVERY DAY 05/25/19   Hudnall, Shane R, MD  metoprolol tartrate (LOPRESSOR) 25 MG tablet SMARTSIG:.5 Tablet(s) By Mouth Twice Daily 04/30/19   [provider]  ondansetron  (ZOFRAN ) 4 MG tablet Take 1 tablet (4 mg total) by mouth every 6 (six) hours. 03/05/19   Law, Lorane HERO, PA-C  ondansetron  (ZOFRAN -ODT) 4 MG disintegrating tablet Take 1 tablet (4 mg total) by mouth every 8 (eight) hours as needed. 02/17/23   Long, Fonda MATSU, MD  oxyCODONE -acetaminophen  (PERCOCET/ROXICET) 5-325 MG tablet Take 1 tablet by mouth every 8 (eight) hours  as needed for severe pain. 06/26/20   Patel, Shalyn, PA-C  pantoprazole  (PROTONIX ) 20 MG tablet Take 2 tablets (40 mg total) by mouth daily for 14 days. 09/12/20 09/26/20  Dreama Longs, MD  potassium chloride  SA (K-DUR) 20 MEQ tablet Take 1 tablet (20 mEq total) by mouth 2 (two) times daily for 2 days. 10/11/18 10/13/18  Dreama Longs, MD      Allergies    Patient has no known allergies.    Review of Systems   Review of Systems  HENT:  Positive for dental problem.   All other systems reviewed and are negative.   Physical Exam Updated Vital Signs BP 124/79 (BP Location: Left Arm)   Pulse 97   Temp 99.2 F (37.3 C)  (Oral)   Resp 18   Wt 72.6 kg   SpO2 98%   BMI 27.46 kg/m  Physical Exam Vitals and nursing note reviewed.  Constitutional:      General: She is not in acute distress.    Appearance: Normal appearance. She is normal weight. She is not ill-appearing.  HENT:     Head: Normocephalic and atraumatic.     Mouth/Throat:      Comments: Recent global dental extraction.  Small purulent drainage overlying the left lower molar sockets.  Moderate swelling to the left face extending into the upper neck.  No erythema.  No submandibular induration.  Submandibular tenderness to palpation.  Soft palate rises with phonation.  Tongue resting on the floor of the mouth.  No high potato voice.  No drooling, trismus or tripoding. Pulmonary:     Effort: Pulmonary effort is normal. No respiratory distress.  Abdominal:     General: Abdomen is flat.  Musculoskeletal:        General: Normal range of motion.     Cervical back: Neck supple.  Skin:    General: Skin is warm and dry.  Neurological:     Mental Status: She is alert and oriented to person, place, and time.  Psychiatric:        Mood and Affect: Mood normal.        Behavior: Behavior normal.     ED Results / Procedures / Treatments   Labs (all labs ordered are listed, but only abnormal results are displayed) Labs Reviewed - No data to display  EKG None  Radiology No results found.  Procedures Procedures    Medications Ordered in ED Medications  oxyCODONE -acetaminophen  (PERCOCET/ROXICET) 5-325 MG per tablet 1 tablet (1 tablet Oral Given 05/11/23 1254)    ED Course/ Medical Decision Making/ A&P                                 Medical Decision Making This patient presents to the ED for concern of dental pain, this involves an extensive number of treatment options, and is a complaint that carries with it a high risk of complications and morbidity.  The differential diagnosis includes periapical or other periodontal abscess, Ludwig  angina, postoperative pain, dry socket  My initial workup includes labs, imaging, pain control  Additional history obtained from: Nursing notes from this visit.  Afebirle, hemodynamically stable. 63 year old female presenting for left sided facial pain after dental extraction 9 days prior. Had all of her teeth removed. Pain began yesterday. She has not contacted her dentist. No systemic symptoms of infection. She is obese with a large neck. There is some swelling to the  left mandible extending into the upper neck. No overlying erythema or induration. No submandibular induration. There is some purulent drainage appreciated on the skin overlying the left lower molar socket. Suspect a dental infection. No evidence of Ludwig angina. She was started on antibiotics.  On PDMP review patient was prescribed 30 Vicodin tablets on [id number].  She was encouraged to continue taking these for pain as needed.  She was encouraged to follow up with her dentist. She was given return precautions. Stable at discharge.   At this time there does not appear to be any evidence of an acute emergency medical condition and the patient appears stable for discharge with appropriate outpatient follow up. Diagnosis was discussed with patient who verbalizes understanding of care plan and is agreeable to discharge. I have discussed return precautions with patient who verbalizes understanding. Patient encouraged to follow-up with their dentist. All questions answered.  Patient's case discussed with Dr. Dean who agrees with plan to discharge with follow-up.   Note: Portions of this report may have been transcribed using voice recognition software. Every effort was made to ensure accuracy; however, inadvertent computerized transcription errors may still be present.         Final Clinical Impression(s) / ED Diagnoses Final diagnoses:  Dental infection    Rx / DC Orders ED Discharge Orders          Ordered    amoxicillin   (AMOXIL ) 500 MG capsule  2 times daily        05/11/23 [address] [place] 05/11/23 1327    Dean Clarity, MD 05/11/23 1335

## 2023-06-16 ENCOUNTER — Other Ambulatory Visit: Payer: Self-pay

## 2023-06-16 ENCOUNTER — Encounter (HOSPITAL_BASED_OUTPATIENT_CLINIC_OR_DEPARTMENT_OTHER): Payer: Self-pay | Admitting: Emergency Medicine

## 2023-06-16 ENCOUNTER — Emergency Department (HOSPITAL_BASED_OUTPATIENT_CLINIC_OR_DEPARTMENT_OTHER)
Admission: EM | Admit: 2023-06-16 | Discharge: 2023-06-16 | Disposition: A | Discharge status: Home / Self Care | Payer: Commercial Managed Care - HMO | Attending: Emergency Medicine | Admitting: Emergency Medicine

## 2023-06-16 DIAGNOSIS — Z20822 Contact with and (suspected) exposure to covid-19: Secondary | ICD-10-CM | POA: Insufficient documentation

## 2023-06-16 DIAGNOSIS — J069 Acute upper respiratory infection, unspecified: Secondary | ICD-10-CM | POA: Diagnosis not present

## 2023-06-16 DIAGNOSIS — R509 Fever, unspecified: Secondary | ICD-10-CM | POA: Diagnosis present

## 2023-06-16 LAB — RESP PANEL BY RT-PCR (RSV, FLU A&B, COVID)  RVPGX2
Influenza A by PCR: NEGATIVE
Influenza B by PCR: NEGATIVE
Resp Syncytial Virus by PCR: NEGATIVE
SARS Coronavirus 2 by RT PCR: NEGATIVE

## 2023-06-16 MED ORDER — ONDANSETRON 4 MG PO TBDP
4.0000 mg | ORAL_TABLET | Freq: Once | ORAL | Status: AC
  Administered 2023-06-16: 4 mg via ORAL
  Filled 2023-06-16: qty 1

## 2023-06-16 MED ORDER — CELECOXIB 200 MG PO CAPS: 200.0000 mg | ORAL_CAPSULE | Freq: Two times a day (BID) | ORAL | 0 refills | Status: AC

## 2023-06-16 MED ORDER — ONDANSETRON HCL 4 MG PO TABS: 4.0000 mg | ORAL_TABLET | Freq: Four times a day (QID) | ORAL | 0 refills | Status: AC

## 2023-06-16 MED ORDER — KETOROLAC TROMETHAMINE 15 MG/ML IJ SOLN
15.0000 mg | Freq: Once | INTRAMUSCULAR | Status: AC
  Administered 2023-06-16: 15 mg via INTRAMUSCULAR
  Filled 2023-06-16: qty 1

## 2023-06-16 MED ORDER — ACETAMINOPHEN 500 MG PO TABS
1000.0000 mg | ORAL_TABLET | Freq: Once | ORAL | Status: AC
  Administered 2023-06-16: 1000 mg via ORAL
  Filled 2023-06-16: qty 2

## 2023-06-16 NOTE — ED Triage Notes (Signed)
Pt reports body aches, HA, and nausea since 1330 today

## 2023-06-16 NOTE — ED Provider Notes (Signed)
[place] [facility] Provider Note   CSN: [id number] Arrival date & time: 06/16/23  2014     History Chief Complaint  Patient presents with   Flulike Symptoms    HPI Michelle Hess is a 63 y.o. female presenting for fever/cough congestion and generalized malaise.  Patient's recorded medical, surgical, social, medication list and allergies were reviewed in the Snapshot window as part of the initial history.   Review of Systems   Review of Systems  Constitutional:  Negative for chills and fever.  HENT:  Positive for congestion and sore throat. Negative for ear pain.   Eyes:  Negative for pain and visual disturbance.  Respiratory:  Positive for cough. Negative for choking and shortness of breath.   Cardiovascular:  Negative for chest pain and palpitations.  Gastrointestinal:  Negative for abdominal pain and vomiting.  Genitourinary:  Negative for dysuria and hematuria.  Musculoskeletal:  Negative for arthralgias and back pain.  Skin:  Negative for color change and rash.  Neurological:  Negative for seizures and syncope.  All other systems reviewed and are negative.   Physical Exam Updated Vital Signs BP 135/87 (BP Location: Left Arm)   Pulse 98   Temp 98.7 F (37.1 C) (Oral)   Resp 16   Ht 5\' 4"  (1.626 m)   Wt 74.8 kg   SpO2 100%   BMI 28.32 kg/m  Physical Exam Vitals and nursing note reviewed.  Constitutional:      General: She is not in acute distress.    Appearance: She is well-developed.  HENT:     Head: Normocephalic and atraumatic.  Eyes:     Conjunctiva/sclera: Conjunctivae normal.  Cardiovascular:     Rate and Rhythm: Normal rate and regular rhythm.     Heart sounds: No murmur heard. Pulmonary:     Effort: Pulmonary effort is normal. No respiratory distress.     Breath sounds: Normal breath sounds.  Abdominal:     General: There is no distension.     Palpations: Abdomen is soft.     Tenderness: There is no  abdominal tenderness. There is no right CVA tenderness or left CVA tenderness.  Musculoskeletal:        General: No swelling or tenderness. Normal range of motion.     Cervical back: Neck supple.  Skin:    General: Skin is warm and dry.  Neurological:     General: No focal deficit present.     Mental Status: She is alert and oriented to person, place, and time. Mental status is at baseline.     Cranial Nerves: No cranial nerve deficit.      ED Course/ Medical Decision Making/ A&P    Procedures Procedures   Medications Ordered in ED Medications  acetaminophen (TYLENOL) tablet 1,000 mg (1,000 mg Oral Given 06/16/23 2216)  ketorolac (TORADOL) 15 MG/ML injection 15 mg (15 mg Intramuscular Given 06/16/23 2216)  ondansetron (ZOFRAN-ODT) disintegrating tablet 4 mg (4 mg Oral Given 06/16/23 2216)   Medical Decision Making:   Michelle Hess is a 63 y.o. female who presented to the ED today with subjective fever, cough, congestion detailed above.    Complete initial physical exam performed, notably the patient  was hemodynamically stable in no acute distress.  Posterior oropharynx illuminated and without obvious swelling or deformity.  Patient is without neck stiffness.    Reviewed and confirmed nursing documentation for past medical history, family history, social history.    Initial Assessment:  With the patient's presentation of fever cough congestion, most likely diagnosis is developing viral upper respiratory infection. Other diagnoses were considered including (but not limited to) peritonsillar abscess, retropharyngeal abscess, pneumonia. These are considered less likely due to history of present illness and physical exam findings.   This is most consistent with an acute life/limb threatening illness complicated by underlying chronic conditions. Considered meningitis, however patient's symptoms, vital signs, physical exam findings including lack of meningismus seem grossly less consistent  at this time. Initial Plan:  Screening labs including CBC and Metabolic panel to evaluate for infectious or metabolic etiology of disease.  Viral screening including COVID/flu testing to evaluate for common viral etiologies that need to be tracked CXR to evaluate for structural/infectious intrathoracic pathology.  Empiric treatment with antipyretics including acetaminophen in ambulatory setting Objective evaluation as below reviewed   Initial Study Results:   Laboratory  All laboratory results reviewed without evidence of clinically relevant pathology.   Radiology:  All images reviewed independently. Agree with radiology report at this time.   No orders to display       Final Assessment and Plan:   On reassessment, patient is ambulatory tolerating p.o. intake in no acute distress.   Patient is currently stable for outpatient care and management with no indication for hospitalization or transfer at this time.  Discussed all findings with patient expressed understanding.  Disposition:  Based on the above findings, I believe patient is stable for discharge.    Patient/family educated about specific return precautions for given chief complaint and symptoms.  Patient/family educated about follow-up with PCP.     Patient/family expressed understanding of return precautions and need for follow-up. Patient spoken to regarding all imaging and laboratory results and appropriate follow up for these results. All education provided in verbal form with additional information in written form. Time was allowed for answering of patient questions. Patient discharged.    Emergency Department Medication Summary:   Medications  acetaminophen (TYLENOL) tablet 1,000 mg (1,000 mg Oral Given 06/16/23 2216)  ketorolac (TORADOL) 15 MG/ML injection 15 mg (15 mg Intramuscular Given 06/16/23 2216)  ondansetron (ZOFRAN-ODT) disintegrating tablet 4 mg (4 mg Oral Given 06/16/23 2216)    Clinical Impression:  1.  Upper respiratory tract infection, unspecified type      Discharge   Final Clinical Impression(s) / ED Diagnoses Final diagnoses:  Upper respiratory tract infection, unspecified type    Rx / DC Orders ED Discharge Orders          Ordered    ondansetron (ZOFRAN) 4 MG tablet  Every 6 hours        06/16/23 2213    celecoxib (CELEBREX) 200 MG capsule  2 times daily        06/16/23 2213              Glyn Ade, MD 06/16/23 2228

## 2023-06-24 ENCOUNTER — Other Ambulatory Visit: Payer: Self-pay

## 2023-06-24 ENCOUNTER — Other Ambulatory Visit (HOSPITAL_BASED_OUTPATIENT_CLINIC_OR_DEPARTMENT_OTHER): Payer: Self-pay

## 2023-06-24 MED ORDER — CYCLOBENZAPRINE HCL 10 MG PO TABS
10.0000 mg | ORAL_TABLET | Freq: Three times a day (TID) | ORAL | 0 refills | Status: AC
  Filled 2023-06-24 (×2): qty 90, 30d supply, fill #0

## 2023-06-24 MED ORDER — HYDROCODONE-ACETAMINOPHEN 7.5-325 MG PO TABS
1.0000 | ORAL_TABLET | Freq: Four times a day (QID) | ORAL | 0 refills | Status: DC | PRN
  Filled 2023-06-24 (×2): qty 120, 30d supply, fill #0

## 2023-06-24 MED ORDER — LISINOPRIL-HYDROCHLOROTHIAZIDE 20-25 MG PO TABS
1.0000 | ORAL_TABLET | Freq: Every day | ORAL | 0 refills | Status: AC
Start: 2023-06-24 — End: ?
  Filled 2023-06-24: qty 30, 30d supply, fill #0
  Filled 2023-06-24: qty 90, 90d supply, fill #0

## 2023-07-22 ENCOUNTER — Other Ambulatory Visit (HOSPITAL_BASED_OUTPATIENT_CLINIC_OR_DEPARTMENT_OTHER): Payer: Self-pay

## 2023-07-25 ENCOUNTER — Other Ambulatory Visit (HOSPITAL_BASED_OUTPATIENT_CLINIC_OR_DEPARTMENT_OTHER): Payer: Self-pay

## 2023-07-25 MED ORDER — NALOXONE HCL 4 MG/0.1ML NA LIQD
1.0000 | NASAL | 0 refills | Status: AC | PRN
  Filled 2023-07-25: qty 2, 1d supply, fill #0

## 2023-07-25 MED ORDER — HYDROCODONE-ACETAMINOPHEN 7.5-325 MG PO TABS
1.0000 | ORAL_TABLET | Freq: Four times a day (QID) | ORAL | 0 refills | Status: AC | PRN
  Filled 2023-07-25: qty 120, 30d supply, fill #0

## 2023-07-25 MED ORDER — GABAPENTIN 300 MG PO CAPS
300.0000 mg | ORAL_CAPSULE | Freq: Three times a day (TID) | ORAL | 1 refills | Status: AC
  Filled 2023-07-25 – 2023-08-15 (×2): qty 90, 30d supply, fill #0

## 2023-07-25 MED ORDER — SIMVASTATIN 20 MG PO TABS
20.0000 mg | ORAL_TABLET | Freq: Every day | ORAL | 1 refills | Status: AC
  Filled 2023-07-25: qty 30, 30d supply, fill #0

## 2023-07-25 MED ORDER — LISINOPRIL-HYDROCHLOROTHIAZIDE 20-25 MG PO TABS
1.0000 | ORAL_TABLET | Freq: Every day | ORAL | 0 refills | Status: AC
  Filled 2023-07-25: qty 30, 30d supply, fill #0

## 2023-07-25 MED ORDER — CYCLOBENZAPRINE HCL 10 MG PO TABS
10.0000 mg | ORAL_TABLET | Freq: Three times a day (TID) | ORAL | 0 refills | Status: AC
  Filled 2023-07-25: qty 90, 30d supply, fill #0

## 2023-07-25 MED ORDER — TRULICITY 1.5 MG/0.5ML ~~LOC~~ SOAJ
1.5000 mg | SUBCUTANEOUS | 3 refills | Status: AC
Start: 2023-07-25 — End: ?
  Filled 2023-07-25: qty 2, 28d supply, fill #0

## 2023-07-26 ENCOUNTER — Other Ambulatory Visit (HOSPITAL_BASED_OUTPATIENT_CLINIC_OR_DEPARTMENT_OTHER): Payer: Self-pay

## 2023-08-15 ENCOUNTER — Other Ambulatory Visit (HOSPITAL_BASED_OUTPATIENT_CLINIC_OR_DEPARTMENT_OTHER): Payer: Self-pay

## 2023-08-17 ENCOUNTER — Other Ambulatory Visit (HOSPITAL_BASED_OUTPATIENT_CLINIC_OR_DEPARTMENT_OTHER): Payer: Self-pay

## 2023-08-18 ENCOUNTER — Emergency Department (HOSPITAL_BASED_OUTPATIENT_CLINIC_OR_DEPARTMENT_OTHER)

## 2023-08-18 ENCOUNTER — Encounter (HOSPITAL_BASED_OUTPATIENT_CLINIC_OR_DEPARTMENT_OTHER): Payer: Self-pay

## 2023-08-18 ENCOUNTER — Emergency Department (HOSPITAL_BASED_OUTPATIENT_CLINIC_OR_DEPARTMENT_OTHER)
Admission: EM | Admit: 2023-08-18 | Discharge: 2023-08-18 | Disposition: A | Discharge status: Home / Self Care | Attending: Emergency Medicine | Admitting: Emergency Medicine

## 2023-08-18 ENCOUNTER — Other Ambulatory Visit: Payer: Self-pay

## 2023-08-18 ENCOUNTER — Other Ambulatory Visit (HOSPITAL_BASED_OUTPATIENT_CLINIC_OR_DEPARTMENT_OTHER): Payer: Self-pay

## 2023-08-18 DIAGNOSIS — R17 Unspecified jaundice: Secondary | ICD-10-CM | POA: Diagnosis not present

## 2023-08-18 DIAGNOSIS — F17200 Nicotine dependence, unspecified, uncomplicated: Secondary | ICD-10-CM | POA: Insufficient documentation

## 2023-08-18 DIAGNOSIS — R1084 Generalized abdominal pain: Secondary | ICD-10-CM

## 2023-08-18 DIAGNOSIS — E871 Hypo-osmolality and hyponatremia: Secondary | ICD-10-CM | POA: Diagnosis not present

## 2023-08-18 DIAGNOSIS — E876 Hypokalemia: Secondary | ICD-10-CM | POA: Diagnosis not present

## 2023-08-18 DIAGNOSIS — E1140 Type 2 diabetes mellitus with diabetic neuropathy, unspecified: Secondary | ICD-10-CM | POA: Insufficient documentation

## 2023-08-18 DIAGNOSIS — I10 Essential (primary) hypertension: Secondary | ICD-10-CM | POA: Diagnosis not present

## 2023-08-18 DIAGNOSIS — R112 Nausea with vomiting, unspecified: Secondary | ICD-10-CM

## 2023-08-18 LAB — CBC WITH DIFFERENTIAL/PLATELET
Abs Immature Granulocytes: 0.01 10*3/uL (ref 0.00–0.07)
Basophils Absolute: 0 10*3/uL (ref 0.0–0.1)
Basophils Relative: 0 %
Eosinophils Absolute: 0.2 10*3/uL (ref 0.0–0.5)
Eosinophils Relative: 3 %
HCT: 42.2 % (ref 36.0–46.0)
Hemoglobin: 13.9 g/dL (ref 12.0–15.0)
Immature Granulocytes: 0 %
Lymphocytes Relative: 49 %
Lymphs Abs: 3.8 10*3/uL (ref 0.7–4.0)
MCH: 29.6 pg (ref 26.0–34.0)
MCHC: 32.9 g/dL (ref 30.0–36.0)
MCV: 89.8 fL (ref 80.0–100.0)
Monocytes Absolute: 0.4 10*3/uL (ref 0.1–1.0)
Monocytes Relative: 5 %
Neutro Abs: 3.3 10*3/uL (ref 1.7–7.7)
Neutrophils Relative %: 43 %
Platelets: 261 10*3/uL (ref 150–400)
RBC: 4.7 MIL/uL (ref 3.87–5.11)
RDW: 13.6 % (ref 11.5–15.5)
WBC: 7.7 10*3/uL (ref 4.0–10.5)
nRBC: 0 % (ref 0.0–0.2)

## 2023-08-18 LAB — COMPREHENSIVE METABOLIC PANEL WITH GFR
ALT: 12 U/L (ref 0–44)
AST: 15 U/L (ref 15–41)
Albumin: 4 g/dL (ref 3.5–5.0)
Alkaline Phosphatase: 103 U/L (ref 38–126)
Anion gap: 12 (ref 5–15)
BUN: 25 mg/dL — ABNORMAL HIGH (ref 8–23)
CO2: 26 mmol/L (ref 22–32)
Calcium: 9.4 mg/dL (ref 8.9–10.3)
Chloride: 102 mmol/L (ref 98–111)
Creatinine, Ser: 0.96 mg/dL (ref 0.44–1.00)
GFR, Estimated: >60 mL/min (ref >60)
Glucose, Bld: 153 mg/dL — ABNORMAL HIGH (ref 70–99)
Potassium: 3 mmol/L — ABNORMAL LOW (ref 3.5–5.1)
Sodium: 140 mmol/L (ref 135–145)
Total Bilirubin: 1.8 mg/dL — ABNORMAL HIGH (ref 0.0–1.2)
Total Protein: 7.5 g/dL (ref 6.5–8.1)

## 2023-08-18 LAB — ACETAMINOPHEN LEVEL: Acetaminophen (Tylenol), Serum: <10 ug/mL — ABNORMAL LOW (ref 10–30)

## 2023-08-18 LAB — PROTIME-INR
INR: 1 (ref 0.8–1.2)
Prothrombin Time: 13.6 s (ref 11.4–15.2)

## 2023-08-18 LAB — CBG MONITORING, ED: Glucose-Capillary: 115 mg/dL — ABNORMAL HIGH (ref 70–99)

## 2023-08-18 MED ORDER — LOPERAMIDE HCL 2 MG PO TABS
2.0000 mg | ORAL_TABLET | Freq: Four times a day (QID) | ORAL | 0 refills | Status: AC | PRN
  Filled 2023-08-18: qty 24, 6d supply, fill #0

## 2023-08-18 MED ORDER — DICYCLOMINE HCL 10 MG PO CAPS
10.0000 mg | ORAL_CAPSULE | Freq: Once | ORAL | Status: AC
  Administered 2023-08-18: 10 mg via ORAL
  Filled 2023-08-18: qty 1

## 2023-08-18 MED ORDER — ONDANSETRON 4 MG PO TBDP
4.0000 mg | ORAL_TABLET | Freq: Once | ORAL | Status: AC
  Administered 2023-08-18: 4 mg via ORAL
  Filled 2023-08-18: qty 1

## 2023-08-18 MED ORDER — ONDANSETRON 4 MG PO TBDP
4.0000 mg | ORAL_TABLET | Freq: Three times a day (TID) | ORAL | 0 refills | Status: AC | PRN
  Filled 2023-08-18: qty 20, 7d supply, fill #0

## 2023-08-18 MED ORDER — POTASSIUM CHLORIDE CRYS ER 20 MEQ PO TBCR
40.0000 meq | EXTENDED_RELEASE_TABLET | Freq: Every day | ORAL | 0 refills | Status: DC
  Filled 2023-08-18: qty 8, 4d supply, fill #0

## 2023-08-18 MED ORDER — IOHEXOL 300 MG/ML  SOLN
75.0000 mL | Freq: Once | INTRAMUSCULAR | Status: AC | PRN
  Administered 2023-08-18: 75 mL via INTRAVENOUS

## 2023-08-18 MED ORDER — SODIUM CHLORIDE 0.9 % IV BOLUS
500.0000 mL | Freq: Once | INTRAVENOUS | Status: AC
  Administered 2023-08-18: 500 mL via INTRAVENOUS

## 2023-08-18 NOTE — ED Notes (Signed)
 Pt made phone call prior to completing triage. Unable to get initial EKG. Pt given gown and instructed to don once phone call completed so care can be resumed

## 2023-08-18 NOTE — ED Notes (Signed)
 D/c paperwork reviewed with pt, including prescriptions and follow up care.  All questions and/or concerns addressed at time of d/c.  No further needs expressed. . Pt verbalized understanding, Ambulatory without assistance to ED exit, NAD.

## 2023-08-18 NOTE — Discharge Instructions (Signed)
 Please follow with your PCP in the coming week for repeat blood work.  Your bilirubin was slightly elevated at 1.8 but not severely.  Your liver labs look normal.  Your potassium was also slightly low at 3.0.  They can check to make sure these values are coming back into normal range.   If you develop worsening abdominal pain, vomiting, fevers you should return to the emergency department for reevaluation.

## 2023-08-18 NOTE — ED Triage Notes (Signed)
 Pt sent from PCP due to inability to obtain IV. Pt with complaint of abdominal pain x 2 days. Yellow sclera today. N,V,D yesterday, but none today

## 2023-08-18 NOTE — ED Notes (Signed)
 Pt expressed concern that blood sugar may be low, requested to have it checked since she is unable to eat at this time due to waiting for CT results.

## 2023-08-18 NOTE — ED Provider Notes (Signed)
 Emergency Department Provider Note   I have reviewed the triage vital signs and the nursing notes.   HISTORY  Chief Complaint Abdominal Pain   HPI Michelle Hess is a 63 y.o. female with past history of DM, hypertension, neuropathy presents to the emergency department evaluation of cramping lower abdominal pain with nausea/vomiting/diarrhea and was referred to the emergency department.  Patient has prior surgical history of cholecystectomy.  Denies any fevers.  She has been able to tolerate water this morning but no solid foods.  Denies taking Tylenol , alcohol use, drug use.  No new medications.   Past Medical History:  Diagnosis Date   Chronic back pain greater than 3 months duration    Diabetes (HCC)    Hypertension    Neuropathy     Review of Systems  Constitutional: No fever/chills Cardiovascular: Denies chest pain. Respiratory: Denies shortness of breath. Gastrointestinal: Positive abdominal pain. Positive nausea, vomiting, and diarrhea.  No constipation. Neurological: Negative for headaches.  ____________________________________________   PHYSICAL EXAM:  VITAL SIGNS: ED Triage Vitals [08/18/23 0938]  Encounter Vitals Group     BP 127/86     Pulse Rate 88     Resp 18     Temp 98.2 F (36.8 C)     Temp src      SpO2 100 %     Weight 164 lb (74.4 kg)   Constitutional: Alert and oriented. Well appearing and in no acute distress. Eyes: Conjunctivae are normal. Mildly icteric sclera.  Head: Atraumatic. Nose: No congestion/rhinnorhea. Mouth/Throat: Mucous membranes are moist.  Neck: No stridor.   Cardiovascular: Normal rate, regular rhythm. Good peripheral circulation. Grossly normal heart sounds.   Respiratory: Normal respiratory effort.  No retractions. Lungs CTAB. Gastrointestinal: Soft and nontender. No distention.  Musculoskeletal:  No gross deformities of extremities. Neurologic:  Normal speech and language.  Skin:  Skin is warm, dry and intact. No  rash noted.  ____________________________________________   LABS (all labs ordered are listed, but only abnormal results are displayed)  Labs Reviewed  COMPREHENSIVE METABOLIC PANEL WITH GFR - Abnormal; Notable for the following components:      Result Value   Potassium 3.0 (*)    Glucose, Bld 153 (*)    BUN 25 (*)    Total Bilirubin 1.8 (*)    All other components within normal limits  ACETAMINOPHEN  LEVEL - Abnormal; Notable for the following components:   Acetaminophen  (Tylenol ), Serum <10 (*)    All other components within normal limits  CBG MONITORING, ED - Abnormal; Notable for the following components:   Glucose-Capillary 115 (*)    All other components within normal limits  CBC WITH DIFFERENTIAL/PLATELET  PROTIME-INR   ____________________________________________  RADIOLOGY  CT ABDOMEN PELVIS W CONTRAST Result Date: 08/18/2023 CLINICAL DATA:  Abdominal pain for 2 days, yellow sclera, nausea/vomiting/diarrhea yesterday EXAM: CT ABDOMEN AND PELVIS WITH CONTRAST TECHNIQUE: Multidetector CT imaging of the abdomen and pelvis was performed using the standard protocol following bolus administration of intravenous contrast. RADIATION DOSE REDUCTION: This exam was performed according to the departmental dose-optimization program which includes automated exposure control, adjustment of the mA and/or kV according to patient size and/or use of iterative reconstruction technique. CONTRAST:  75mL OMNIPAQUE  IOHEXOL  300 MG/ML  SOLN COMPARISON:  None Available. FINDINGS: Lower chest: No acute pleural or parenchymal lung disease. Hepatobiliary: Prior cholecystectomy. The liver is unremarkable, with no focal parenchymal abnormality or biliary duct dilation. Pancreas: Unremarkable. No pancreatic ductal dilatation or surrounding inflammatory changes. Spleen: Normal in  size without focal abnormality. Adrenals/Urinary Tract: Incidental left adrenal nodule measuring up to 2.1 cm, with attenuation of 63  HU. The right adrenal is unremarkable. Simple cyst lower pole right kidney does not require specific imaging follow-up. Otherwise the kidneys enhance normally and symmetrically. No urinary tract calculi or obstructive uropathy. Bladder is unremarkable. Stomach/Bowel: No bowel obstruction or ileus. Normal appendix right lower quadrant. Mild wall thickening of the distal descending and sigmoid colon, nonspecific but likely due to decompressed state. No inflammatory changes. Vascular/Lymphatic: Aortic atherosclerosis. No enlarged abdominal or pelvic lymph nodes. Reproductive: Uterus and bilateral adnexa are unremarkable. Other: No free fluid or free intraperitoneal gas. No abdominal wall hernia. Musculoskeletal: No acute or destructive bony abnormalities. Reconstructed images demonstrate additional findings. IMPRESSION: 1. No acute intra-abdominal or intrapelvic process. 2. 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 Recommend adrenal washout CT or chemical shift MRI. JACR 2017 Aug; 14(8):1038-44, JCAT 2016 Mar-Apr; 40(2):194-200, Urol J 2006 Spring; 3(2):71-4. Electronically Signed   By: Bobbye Burrow M.D.   On: 08/18/2023 13:48    ____________________________________________   PROCEDURES  Procedure(s) performed:   Procedures  None ____________________________________________   INITIAL IMPRESSION / ASSESSMENT AND PLAN / ED COURSE  Pertinent labs & imaging results that were available during my care of the patient were reviewed by me and considered in my medical decision making (see chart for details).   This patient is Presenting for Evaluation of abdominal pain, which does require a range of treatment options, and is a complaint that involves a high risk of morbidity and mortality.  The Differential Diagnoses includes but is not exclusive to acute cholecystitis, intrathoracic causes for epigastric abdominal pain, gastritis, duodenitis, pancreatitis, small bowel or large bowel  obstruction, abdominal aortic aneurysm, hernia, gastritis, etc.   Critical Interventions-    Medications  sodium chloride  0.9 % bolus 500 mL (0 mLs Intravenous Stopped 08/18/23 1149)  dicyclomine  (BENTYL ) capsule 10 mg (10 mg Oral Given 08/18/23 1012)  ondansetron  (ZOFRAN -ODT) disintegrating tablet 4 mg (4 mg Oral Given 08/18/23 1012)  iohexol  (OMNIPAQUE ) 300 MG/ML solution 75 mL (75 mLs Intravenous Contrast Given 08/18/23 1114)    Reassessment after intervention:  symptoms improved.   Clinical Laboratory Tests Ordered, included CMP with mild hyponatremia to 3.0.  No acute kidney injury.  Bilirubin slightly elevated to 1.8 but normal LFTs, alk phos.  Radiologic Tests Ordered, included CT abdomen/pelvis. I independently interpreted the images and agree with radiology interpretation.   Cardiac Monitor Tracing which shows NSR.    Social Determinants of Health Risk patient is a smoker.   Medical Decision Making: Summary:  Patient presents emergency department for evaluation of abdominal pain with vomiting.  Mildly icteric sclera.  Plan for CT abdomen pelvis and labs.  Patient with prior history of cholecystectomy.  Reevaluation with update and discussion with patient.  Symptoms improved.  No abnormal finding in the remaining biliary system.  No inflammation about the pancreas.  Symptoms well-controlled in the ED.  Will need close monitoring of her bilirubin as an outpatient.  Plan for symptom management, potassium replacement, PCP follow-up in the coming week.   Considered admission but symptoms improved. Stable for discharge.   Patient's presentation is most consistent with acute presentation with potential threat to life or bodily function.   Disposition: discharge  ____________________________________________  FINAL CLINICAL IMPRESSION(S) / ED DIAGNOSES  Final diagnoses:  Generalized abdominal pain  Nausea vomiting and diarrhea  Hypokalemia     NEW OUTPATIENT MEDICATIONS  STARTED DURING THIS VISIT:  Discharge Medication List as of 08/18/2023  2:21 PM     START taking these medications   Details  loperamide  (IMODIUM  A-D) 2 MG tablet Take 1 capsule (2 mg total) by mouth 4 (four) times daily as needed for diarrhea or loose stools., Starting Thu 08/18/2023, Normal        Note:  This document was prepared using Dragon voice recognition software and may include unintentional dictation errors.  Abby Hocking, MD, [facility] Emergency Medicine    Maverik Foot, Shereen Dike, MD 08/19/23 [phone]

## 2023-08-18 NOTE — ED Notes (Signed)
 Attempted to obtain EKG, patient on phone.

## 2023-08-26 ENCOUNTER — Other Ambulatory Visit (HOSPITAL_BASED_OUTPATIENT_CLINIC_OR_DEPARTMENT_OTHER): Payer: Self-pay

## 2023-08-26 MED ORDER — CYCLOBENZAPRINE HCL 10 MG PO TABS
10.0000 mg | ORAL_TABLET | Freq: Three times a day (TID) | ORAL | 0 refills | Status: AC
  Filled 2023-08-26 (×2): qty 90, 30d supply, fill #0
  Filled 2023-09-21: qty 90, 30d supply, fill #1

## 2023-08-26 MED ORDER — HYDROCODONE-ACETAMINOPHEN 10-325 MG PO TABS
1.0000 | ORAL_TABLET | Freq: Four times a day (QID) | ORAL | 0 refills | Status: AC
Start: 2023-08-26 — End: ?
  Filled 2023-08-26: qty 120, 30d supply, fill #0

## 2023-08-26 MED ORDER — INSULIN GLARGINE 100 UNIT/ML ~~LOC~~ SOLN
SUBCUTANEOUS | 3 refills | Status: AC
Start: 2023-08-26 — End: ?
  Filled 2023-08-26: qty 10, 30d supply, fill #0

## 2023-08-26 MED ORDER — GABAPENTIN 300 MG PO CAPS
300.0000 mg | ORAL_CAPSULE | Freq: Three times a day (TID) | ORAL | 1 refills | Status: AC
  Filled 2023-08-26: qty 90, 30d supply, fill #0
  Filled 2023-09-05: qty 270, 90d supply, fill #0

## 2023-09-05 ENCOUNTER — Other Ambulatory Visit (HOSPITAL_BASED_OUTPATIENT_CLINIC_OR_DEPARTMENT_OTHER): Payer: Self-pay

## 2023-09-14 ENCOUNTER — Other Ambulatory Visit (HOSPITAL_BASED_OUTPATIENT_CLINIC_OR_DEPARTMENT_OTHER): Payer: Self-pay

## 2023-09-14 MED ORDER — LISINOPRIL-HYDROCHLOROTHIAZIDE 20-25 MG PO TABS
1.0000 | ORAL_TABLET | Freq: Every day | ORAL | 0 refills | Status: AC
  Filled 2023-09-14: qty 90, 90d supply, fill #0

## 2023-09-16 ENCOUNTER — Other Ambulatory Visit (HOSPITAL_BASED_OUTPATIENT_CLINIC_OR_DEPARTMENT_OTHER): Payer: Self-pay

## 2023-09-21 ENCOUNTER — Other Ambulatory Visit (HOSPITAL_BASED_OUTPATIENT_CLINIC_OR_DEPARTMENT_OTHER): Payer: Self-pay

## 2023-09-23 ENCOUNTER — Emergency Department (HOSPITAL_BASED_OUTPATIENT_CLINIC_OR_DEPARTMENT_OTHER)
Admission: EM | Admit: 2023-09-23 | Discharge: 2023-09-23 | Disposition: A | Discharge status: Home / Self Care | Payer: Self-pay | Attending: Emergency Medicine | Admitting: Emergency Medicine

## 2023-09-23 ENCOUNTER — Encounter (HOSPITAL_BASED_OUTPATIENT_CLINIC_OR_DEPARTMENT_OTHER): Payer: Self-pay | Admitting: *Deleted

## 2023-09-23 ENCOUNTER — Other Ambulatory Visit: Payer: Self-pay

## 2023-09-23 DIAGNOSIS — M79605 Pain in left leg: Secondary | ICD-10-CM | POA: Diagnosis present

## 2023-09-23 DIAGNOSIS — M545 Low back pain, unspecified: Secondary | ICD-10-CM | POA: Diagnosis not present

## 2023-09-23 DIAGNOSIS — Z794 Long term (current) use of insulin: Secondary | ICD-10-CM | POA: Insufficient documentation

## 2023-09-23 DIAGNOSIS — E119 Type 2 diabetes mellitus without complications: Secondary | ICD-10-CM | POA: Diagnosis not present

## 2023-09-23 MED ORDER — METHYLPREDNISOLONE SODIUM SUCC 125 MG IJ SOLR
125.0000 mg | Freq: Once | INTRAMUSCULAR | Status: AC
  Administered 2023-09-23: 125 mg via INTRAMUSCULAR
  Filled 2023-09-23: qty 2

## 2023-09-23 MED ORDER — KETOROLAC TROMETHAMINE 30 MG/ML IJ SOLN
30.0000 mg | Freq: Once | INTRAMUSCULAR | Status: AC
  Administered 2023-09-23: 30 mg via INTRAMUSCULAR
  Filled 2023-09-23: qty 1
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
## 2023-09-23 NOTE — Discharge Instructions (Signed)
 Return if any problems.
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
## 2023-09-29 ENCOUNTER — Other Ambulatory Visit (HOSPITAL_BASED_OUTPATIENT_CLINIC_OR_DEPARTMENT_OTHER): Payer: Self-pay

## 2023-09-29 MED ORDER — HYDROCODONE-ACETAMINOPHEN 10-325 MG PO TABS
1.0000 | ORAL_TABLET | Freq: Four times a day (QID) | ORAL | 0 refills | Status: DC | PRN
  Filled 2023-09-29: qty 120, 30d supply, fill #0

## 2023-09-29 MED ORDER — CELECOXIB 200 MG PO CAPS
200.0000 mg | ORAL_CAPSULE | Freq: Every day | ORAL | 1 refills | Status: AC
  Filled 2023-09-29: qty 30, 30d supply, fill #0
  Filled 2023-10-16: qty 30, 30d supply, fill #1

## 2023-09-29 MED ORDER — SIMVASTATIN 20 MG PO TABS
20.0000 mg | ORAL_TABLET | Freq: Every day | ORAL | 1 refills | Status: AC
  Filled 2023-09-29: qty 90, 90d supply, fill #0

## 2023-10-17 ENCOUNTER — Other Ambulatory Visit (HOSPITAL_BASED_OUTPATIENT_CLINIC_OR_DEPARTMENT_OTHER): Payer: Self-pay

## 2023-10-18 ENCOUNTER — Other Ambulatory Visit (HOSPITAL_BASED_OUTPATIENT_CLINIC_OR_DEPARTMENT_OTHER): Payer: Self-pay

## 2023-10-27 ENCOUNTER — Other Ambulatory Visit (HOSPITAL_BASED_OUTPATIENT_CLINIC_OR_DEPARTMENT_OTHER): Payer: Self-pay

## 2023-10-27 MED ORDER — CEPHALEXIN 500 MG PO CAPS
500.0000 mg | ORAL_CAPSULE | Freq: Three times a day (TID) | ORAL | 0 refills | Status: AC
  Filled 2023-10-27: qty 21, 7d supply, fill #0

## 2023-10-27 MED ORDER — CYCLOBENZAPRINE HCL 10 MG PO TABS
10.0000 mg | ORAL_TABLET | Freq: Three times a day (TID) | ORAL | 0 refills | Status: AC
  Filled 2023-10-27: qty 90, 30d supply, fill #0
  Filled 2023-12-02 – 2023-12-07 (×3): qty 90, 30d supply, fill #1
  Filled 2024-01-23: qty 90, 30d supply, fill #2

## 2023-10-27 MED ORDER — CELECOXIB 100 MG PO CAPS
100.0000 mg | ORAL_CAPSULE | Freq: Two times a day (BID) | ORAL | 1 refills | Status: AC
  Filled 2023-10-27: qty 60, 30d supply, fill #0

## 2023-10-27 MED ORDER — HYDROCODONE-ACETAMINOPHEN 10-325 MG PO TABS
1.0000 | ORAL_TABLET | Freq: Four times a day (QID) | ORAL | 0 refills | Status: DC | PRN
  Filled 2023-10-27: qty 120, 30d supply, fill #0

## 2023-11-28 ENCOUNTER — Other Ambulatory Visit: Payer: Self-pay

## 2023-11-28 ENCOUNTER — Other Ambulatory Visit (HOSPITAL_BASED_OUTPATIENT_CLINIC_OR_DEPARTMENT_OTHER): Payer: Self-pay

## 2023-11-28 MED ORDER — GABAPENTIN 600 MG PO TABS
600.0000 mg | ORAL_TABLET | Freq: Three times a day (TID) | ORAL | 0 refills | Status: AC
  Filled 2023-11-28: qty 270, 90d supply, fill #0

## 2023-11-28 MED ORDER — INSULIN SYRINGE-NEEDLE U-100 30G X 5/16" 0.5 ML MISC
1.0000 | Freq: Every day | 2 refills | Status: AC
  Filled 2023-11-28: qty 100, 90d supply, fill #0

## 2023-11-28 MED ORDER — HYDROCODONE-ACETAMINOPHEN 10-325 MG PO TABS
1.0000 | ORAL_TABLET | Freq: Four times a day (QID) | ORAL | 0 refills | Status: AC | PRN
  Filled 2023-11-28: qty 120, 30d supply, fill #0

## 2023-11-28 MED ORDER — INSULIN GLARGINE 100 UNIT/ML ~~LOC~~ SOLN
20.0000 [IU] | Freq: Every day | SUBCUTANEOUS | 3 refills | Status: AC
  Filled 2023-11-28: qty 10, 50d supply, fill #0

## 2023-11-28 MED ORDER — LISINOPRIL-HYDROCHLOROTHIAZIDE 20-25 MG PO TABS
1.0000 | ORAL_TABLET | Freq: Every day | ORAL | 0 refills | Status: AC
  Filled 2023-11-28: qty 90, 90d supply, fill #0

## 2023-11-28 MED ORDER — SIMVASTATIN 20 MG PO TABS
20.0000 mg | ORAL_TABLET | Freq: Every day | ORAL | 1 refills | Status: AC
  Filled 2023-11-28: qty 90, 90d supply, fill #0

## 2023-11-28 MED ORDER — CELECOXIB 100 MG PO CAPS
100.0000 mg | ORAL_CAPSULE | Freq: Two times a day (BID) | ORAL | 1 refills | Status: AC
  Filled 2023-11-28: qty 60, 30d supply, fill #0
  Filled 2024-01-23: qty 60, 30d supply, fill #1

## 2023-11-30 ENCOUNTER — Other Ambulatory Visit (HOSPITAL_BASED_OUTPATIENT_CLINIC_OR_DEPARTMENT_OTHER): Payer: Self-pay

## 2023-12-01 ENCOUNTER — Other Ambulatory Visit (HOSPITAL_BASED_OUTPATIENT_CLINIC_OR_DEPARTMENT_OTHER): Payer: Self-pay

## 2023-12-02 ENCOUNTER — Other Ambulatory Visit (HOSPITAL_BASED_OUTPATIENT_CLINIC_OR_DEPARTMENT_OTHER): Payer: Self-pay

## 2023-12-02 ENCOUNTER — Other Ambulatory Visit: Payer: Self-pay

## 2023-12-07 ENCOUNTER — Other Ambulatory Visit: Payer: Self-pay

## 2023-12-07 ENCOUNTER — Other Ambulatory Visit (HOSPITAL_BASED_OUTPATIENT_CLINIC_OR_DEPARTMENT_OTHER): Payer: Self-pay

## 2024-01-11 ENCOUNTER — Other Ambulatory Visit (HOSPITAL_BASED_OUTPATIENT_CLINIC_OR_DEPARTMENT_OTHER): Payer: Self-pay

## 2024-01-11 MED ORDER — HYDROCODONE-ACETAMINOPHEN 10-325 MG PO TABS
ORAL_TABLET | ORAL | 0 refills | Status: AC
  Filled 2024-01-11: qty 120, 30d supply, fill #0

## 2024-01-23 ENCOUNTER — Other Ambulatory Visit (HOSPITAL_BASED_OUTPATIENT_CLINIC_OR_DEPARTMENT_OTHER): Payer: Self-pay

## 2024-01-31 ENCOUNTER — Other Ambulatory Visit (HOSPITAL_BASED_OUTPATIENT_CLINIC_OR_DEPARTMENT_OTHER): Payer: Self-pay

## 2024-02-01 ENCOUNTER — Other Ambulatory Visit (HOSPITAL_BASED_OUTPATIENT_CLINIC_OR_DEPARTMENT_OTHER): Payer: Self-pay

## 2024-02-01 MED ORDER — CYPROHEPTADINE HCL 4 MG PO TABS
4.0000 mg | ORAL_TABLET | Freq: Every day | ORAL | 0 refills | Status: AC
  Filled 2024-02-01: qty 30, 30d supply, fill #0

## 2024-02-03 ENCOUNTER — Other Ambulatory Visit (HOSPITAL_BASED_OUTPATIENT_CLINIC_OR_DEPARTMENT_OTHER): Payer: Self-pay

## 2024-02-12 ENCOUNTER — Other Ambulatory Visit (HOSPITAL_BASED_OUTPATIENT_CLINIC_OR_DEPARTMENT_OTHER): Payer: Self-pay

## 2024-02-12 MED ORDER — INSULIN GLARGINE 100 UNIT/ML ~~LOC~~ SOLN
20.0000 [IU] | Freq: Every day | SUBCUTANEOUS | 3 refills | Status: AC
  Filled 2024-02-12: qty 10, 28d supply, fill #0

## 2024-02-12 MED ORDER — CYCLOBENZAPRINE HCL 10 MG PO TABS: 10.0000 mg | ORAL_TABLET | Freq: Three times a day (TID) | ORAL | 0 refills | Status: AC

## 2024-02-12 MED ORDER — HYDROCODONE-ACETAMINOPHEN 10-325 MG PO TABS
1.0000 | ORAL_TABLET | Freq: Four times a day (QID) | ORAL | 0 refills | Status: DC | PRN
  Filled 2024-02-12: qty 120, 30d supply, fill #0

## 2024-02-12 MED ORDER — CELECOXIB 100 MG PO CAPS: 100.0000 mg | ORAL_CAPSULE | Freq: Two times a day (BID) | ORAL | 1 refills | Status: AC

## 2024-02-13 ENCOUNTER — Other Ambulatory Visit (HOSPITAL_BASED_OUTPATIENT_CLINIC_OR_DEPARTMENT_OTHER): Payer: Self-pay

## 2024-03-06 ENCOUNTER — Other Ambulatory Visit: Payer: Self-pay

## 2024-03-06 ENCOUNTER — Encounter (HOSPITAL_BASED_OUTPATIENT_CLINIC_OR_DEPARTMENT_OTHER): Payer: Self-pay | Admitting: Emergency Medicine

## 2024-03-06 ENCOUNTER — Emergency Department (HOSPITAL_BASED_OUTPATIENT_CLINIC_OR_DEPARTMENT_OTHER)
Admission: EM | Admit: 2024-03-06 | Discharge: 2024-03-07 | Disposition: A | Discharge status: Home / Self Care | Attending: Emergency Medicine | Admitting: Emergency Medicine

## 2024-03-06 DIAGNOSIS — Z79899 Other long term (current) drug therapy: Secondary | ICD-10-CM | POA: Insufficient documentation

## 2024-03-06 DIAGNOSIS — R739 Hyperglycemia, unspecified: Secondary | ICD-10-CM | POA: Diagnosis present

## 2024-03-06 DIAGNOSIS — N179 Acute kidney failure, unspecified: Secondary | ICD-10-CM | POA: Insufficient documentation

## 2024-03-06 DIAGNOSIS — I10 Essential (primary) hypertension: Secondary | ICD-10-CM | POA: Diagnosis not present

## 2024-03-06 DIAGNOSIS — E1165 Type 2 diabetes mellitus with hyperglycemia: Secondary | ICD-10-CM | POA: Diagnosis not present

## 2024-03-06 DIAGNOSIS — E876 Hypokalemia: Secondary | ICD-10-CM

## 2024-03-06 DIAGNOSIS — E871 Hypo-osmolality and hyponatremia: Secondary | ICD-10-CM | POA: Diagnosis not present

## 2024-03-06 DIAGNOSIS — Z794 Long term (current) use of insulin: Secondary | ICD-10-CM | POA: Diagnosis not present

## 2024-03-06 DIAGNOSIS — E878 Other disorders of electrolyte and fluid balance, not elsewhere classified: Secondary | ICD-10-CM | POA: Diagnosis not present

## 2024-03-06 LAB — CBC
HCT: 44.8 % (ref 36.0–46.0)
Hemoglobin: 15.1 g/dL — ABNORMAL HIGH (ref 12.0–15.0)
MCH: 30.6 pg (ref 26.0–34.0)
MCHC: 33.7 g/dL (ref 30.0–36.0)
MCV: 90.9 fL (ref 80.0–100.0)
Platelets: 242 K/uL (ref 150–400)
RBC: 4.93 MIL/uL (ref 3.87–5.11)
RDW: 13.1 % (ref 11.5–15.5)
WBC: 7.8 K/uL (ref 4.0–10.5)
nRBC: 0 % (ref 0.0–0.2)

## 2024-03-06 LAB — BASIC METABOLIC PANEL WITH GFR
Anion gap: 16 — ABNORMAL HIGH (ref 5–15)
BUN: 25 mg/dL — ABNORMAL HIGH (ref 8–23)
CO2: 23 mmol/L (ref 22–32)
Calcium: 9.5 mg/dL (ref 8.9–10.3)
Chloride: 91 mmol/L — ABNORMAL LOW (ref 98–111)
Creatinine, Ser: 1.87 mg/dL — ABNORMAL HIGH (ref 0.44–1.00)
GFR, Estimated: 30 mL/min — ABNORMAL LOW (ref >60)
Glucose, Bld: 690 mg/dL (ref 70–99)
Potassium: 3.9 mmol/L (ref 3.5–5.1)
Sodium: 130 mmol/L — ABNORMAL LOW (ref 135–145)

## 2024-03-06 LAB — I-STAT VENOUS BLOOD GAS, ED
Acid-Base Excess: 0 mmol/L (ref 0.0–2.0)
Bicarbonate: 27.3 mmol/L (ref 20.0–28.0)
Calcium, Ion: 1.18 mmol/L (ref 1.15–1.40)
HCT: 45 % (ref 36.0–46.0)
Hemoglobin: 15.3 g/dL — ABNORMAL HIGH (ref 12.0–15.0)
O2 Saturation: 52 %
Patient temperature: 98.4
Potassium: 3.6 mmol/L (ref 3.5–5.1)
Sodium: 130 mmol/L — ABNORMAL LOW (ref 135–145)
TCO2: 29 mmol/L (ref 22–32)
pCO2, Ven: 52.1 mmHg (ref 44–60)
pH, Ven: 7.327 (ref 7.25–7.43)
pO2, Ven: 30 mmHg — CL (ref 32–45)

## 2024-03-06 LAB — URINALYSIS, ROUTINE W REFLEX MICROSCOPIC
Bilirubin Urine: NEGATIVE
Glucose, UA: >=500 mg/dL — AB
Ketones, ur: NEGATIVE mg/dL
Nitrite: NEGATIVE
Protein, ur: NEGATIVE mg/dL
Specific Gravity, Urine: <=1.005 (ref 1.005–1.030)
pH: 5.5 (ref 5.0–8.0)

## 2024-03-06 LAB — CBG MONITORING, ED
Glucose-Capillary: >600 mg/dL (ref 70–99)
Glucose-Capillary: 540 mg/dL (ref 70–99)
Glucose-Capillary: 597 mg/dL (ref 70–99)
Glucose-Capillary: 440 mg/dL — ABNORMAL HIGH (ref 70–99)
Glucose-Capillary: 364 mg/dL — ABNORMAL HIGH (ref 70–99)
Glucose-Capillary: 417 mg/dL — ABNORMAL HIGH (ref 70–99)

## 2024-03-06 LAB — HEPATIC FUNCTION PANEL
ALT: 10 U/L (ref 0–44)
AST: 15 U/L (ref 15–41)
Albumin: 4 g/dL (ref 3.5–5.0)
Alkaline Phosphatase: 144 U/L — ABNORMAL HIGH (ref 38–126)
Bilirubin, Direct: 0.3 mg/dL — ABNORMAL HIGH (ref 0.0–0.2)
Indirect Bilirubin: 0.6 mg/dL (ref 0.3–0.9)
Total Bilirubin: 0.9 mg/dL (ref 0.0–1.2)
Total Protein: 6.9 g/dL (ref 6.5–8.1)

## 2024-03-06 LAB — URINALYSIS, MICROSCOPIC (REFLEX)

## 2024-03-06 LAB — BETA-HYDROXYBUTYRIC ACID: Beta-Hydroxybutyric Acid: 0.1 mmol/L (ref 0.05–0.27)

## 2024-03-06 MED ORDER — DEXTROSE 50 % IV SOLN: 0.0000 mL | INTRAVENOUS | Status: DC | PRN

## 2024-03-06 MED ORDER — LACTATED RINGERS IV SOLN: INTRAVENOUS | Status: DC

## 2024-03-06 MED ORDER — INSULIN REGULAR(HUMAN) IN NACL 100-0.9 UT/100ML-% IV SOLN
INTRAVENOUS | Status: DC
  Administered 2024-03-06: 9.5 [IU]/h via INTRAVENOUS
  Filled 2024-03-06: qty 100

## 2024-03-06 MED ORDER — LACTATED RINGERS IV BOLUS: 1000.0000 mL | INTRAVENOUS | Status: DC

## 2024-03-06 MED ORDER — LACTATED RINGERS IV BOLUS
1000.0000 mL | Freq: Once | INTRAVENOUS | Status: AC
  Administered 2024-03-06: 1000 mL via INTRAVENOUS

## 2024-03-06 MED ORDER — DEXTROSE IN LACTATED RINGERS 5 % IV SOLN: INTRAVENOUS | Status: DC

## 2024-03-06 NOTE — Progress Notes (Signed)
 Hospitalist Transfer Note:    Nursing staff, Please call [facility] Admits & Consults System-Wide number on Amion [phone]) as soon as patient's arrival, so appropriate admitting provider can evaluate the pt.   Transferring facility: [facility] Requesting provider: Rosebud Fass, PA (EDP at [facility]) Reason for transfer: admission for further evaluation and management of hyperglycemia, without evidence of DKA, along with AKI and generalized weakness.     75 F w/ h/o DM2, who presented to [facility] ED complaining of 2 to 3 days of generalized weakness in the absence of any acute focal weakness.  Denies any recent subjective fever/chills.  No recent chest pain or shortness of breath.  She has a history of insulin -dependent type 2 diabetes mellitus.  Vital signs in the ED were notable for the following: Afebrile; initial sinus tachycardia with heart rates in the low 100s, subsequent improving into the 70s to 80s following initiation of IV fluids; initial systolic blood pressures in the 90s, improving into the 1 teens to 120s following initiation of IV fluids; respiratory rate 16, and O2 sats in the range of 97 to 100% on room air.  Labs were notable for the following: Initial CBG reported to be 690, with BMP showing bicarbonate 23, anion gap 16, and creatinine 1.87 compared to most recent prior value of 0.96 when checked in April 2025.  Beta-hydroxybutyrate acid was 0.10.  I-STAT VBG showed 7.327/52.1.  CBC demonstrated white blood cell count of 7800.  Urinalysis was reported to showed no evidence of UTI. No evaluation of serum osmolality.   Medications administered prior to transfer included the following: Initiation of insulin  drip via Endo tool.    Subsequently, I accepted this patient for transfer for observation admission to a sdu/pcu bed at [facility] or [facility] (first available) for further work-up and management of the above.     Eva Pore, DO Hospitalist

## 2024-03-06 NOTE — ED Triage Notes (Signed)
 Pt c/o dizziness/light headed while at work today. Checked blood sugar and was 567.   Pt reports compliance with medications. Reports diarrhea yesterday, none today.

## 2024-03-06 NOTE — ED Notes (Signed)
Pt aware UA is needed.

## 2024-03-06 NOTE — ED Provider Notes (Signed)
 [facility] Provider Note   CSN: [id number] Arrival date & time: 03/06/24  1825     Patient presents with: Hyperglycemia and Dizziness   Michelle Hess is a 63 y.o. female.   Patient with history of diabetes, hypertension presents today with complaints of hyperglycemia. Reports that she has been feeling generally unwell for the past few days. Reports during that time her blood sugars have been in the 300-400s. Today she started to feel lightheaded, checked her sugar and it was in the 500s. Presents for same. Reports she is on long acting insulin  which she takes at night, she is not on any short acting insulin . She has been complaint with her long acting insulin . She denies any chest pain, shortness of breath, fevers, chills, abdominal pain, nausea, or vomiting. She did have a few episodes of diarrhea yesterday but none today. Denies urinary symptoms, reports she has been urinating normally.   The history is provided by the patient. No language interpreter was used.  Hyperglycemia Dizziness      Prior to Admission medications   Medication Sig Start Date End Date Taking? Authorizing Provider  atorvastatin (LIPITOR) 80 MG tablet Take 80 mg by mouth at bedtime. 11/11/17   [provider]  benzonatate  (TESSALON ) 100 MG capsule Take 1 capsule (100 mg total) by mouth every 8 (eight) hours. 02/17/23   Long, Joshua G, MD  busPIRone (BUSPAR) 15 MG tablet Take 15 mg by mouth 3 (three) times daily as needed. 09/14/18   [provider]  celecoxib  (CELEBREX ) 100 MG capsule Take 1 capsule (100 mg total) by mouth 2 (two) times daily. Start on 10/29/23. 10/27/23     celecoxib  (CELEBREX ) 100 MG capsule Take 1 capsule (100 mg total) by mouth 2 (two) times daily. 11/28/23     celecoxib  (CELEBREX ) 100 MG capsule Take 1 capsule (100 mg total) by mouth 2 (two) times daily. 02/11/24     celecoxib  (CELEBREX ) 200 MG capsule Take 1 capsule (200 mg  total) by mouth 2 (two) times daily. 06/16/23   Jerral Meth, MD  celecoxib  (CELEBREX ) 200 MG capsule Take 1 capsule (200 mg total) by mouth daily. Start on 10/01/23 09/29/23     cephALEXin  (KEFLEX ) 500 MG capsule Take 1 capsule (500 mg total) by mouth 3 (three) times daily. 10/27/23     citalopram (CELEXA) 20 MG tablet Take 20 mg by mouth daily. 11/11/17   [provider]  cyclobenzaprine  (FLEXERIL ) 10 MG tablet Take 1 tablet (10 mg total) by mouth 3 (three) times daily. 06/24/23     cyclobenzaprine  (FLEXERIL ) 10 MG tablet Take 1 tablet (10 mg total) by mouth 3 (three) times daily. 07/25/23     cyclobenzaprine  (FLEXERIL ) 10 MG tablet Take 1 tablet (10 mg total) by mouth 3 (three) times daily. 08/26/23     cyclobenzaprine  (FLEXERIL ) 10 MG tablet Take 1 tablet (10 mg total) by mouth 3 (three) times daily. 10/27/23     cyclobenzaprine  (FLEXERIL ) 10 MG tablet Take 1 tablet (10 mg total) by mouth 3 (three) times daily. 02/11/24     cyproheptadine (PERIACTIN) 4 MG tablet Take 1 tablet (4 mg total) by mouth every day at bedtime for appetite. 02/01/24     Dulaglutide  (TRULICITY ) 1.5 MG/0.5ML SOAJ Inject 1.5 mg into the skin once a week. 07/25/23     erythromycin  ophthalmic ointment Place a 1/2 inch ribbon of ointment into the lower eyelid. 04/13/19   Rolan Sor A, PA-C  gabapentin  (NEURONTIN )  300 MG capsule Take 300 mg by mouth 3 (three) times daily.    [provider]  gabapentin  (NEURONTIN ) 300 MG capsule Take 1 capsule (300 mg total) by mouth 3 (three) times daily. 07/25/23     gabapentin  (NEURONTIN ) 300 MG capsule Take 1 capsule (300 mg total) by mouth 3 (three) times daily. 08/26/23     gabapentin  (NEURONTIN ) 600 MG tablet Take 1 tablet (600 mg total) by mouth 3 (three) times daily. 11/28/23     HYDROcodone -acetaminophen  (NORCO) 10-325 MG tablet Take 1 tablet by mouth every 6 (six) hours as needed for pain. 08/26/23     HYDROcodone -acetaminophen  (NORCO) 10-325 MG tablet Take 1 tablet by  mouth every 6 (six) hours as needed for pain. 11/28/23     HYDROcodone -acetaminophen  (NORCO) 10-325 MG tablet 1 Tablet every six hours, as needed for pain 01/11/24     HYDROcodone -acetaminophen  (NORCO) 10-325 MG tablet Take 1 tablet by mouth every 6 (six) hours as needed. 02/11/24     HYDROcodone -acetaminophen  (NORCO) 7.5-325 MG tablet Take 1 tablet by mouth every 6 (six) hours as needed for pain. 07/25/23     ibuprofen  (ADVIL ) 600 MG tablet Take 1 tablet (600 mg total) by mouth every 6 (six) hours as needed. 02/26/19   Dean Clarity, MD  insulin  glargine (LANTUS ) 100 UNIT/ML injection inject 25 units under skin at bedtime. 08/26/23     insulin  glargine (LANTUS ) 100 UNIT/ML injection Inject 0.2 mLs (20 Units total) into the skin at bedtime. 11/28/23     insulin  glargine (LANTUS ) 100 UNIT/ML injection Inject 0.2 mLs (20 Units total) into the skin at bedtime. Discard 28 days after vial first used. 02/11/24     Insulin  Syringe-Needle U-100 (BD SAFETYGLIDE INSULIN  SYRINGE) 30G X 5/16 0.5 ML MISC Use 1 daily as directed. 11/28/23     LANTUS  SOLOSTAR 100 UNIT/ML Solostar Pen Inject 25 Units into the skin at bedtime.  03/16/19   [provider]  lidocaine  (LIDODERM ) 5 % Place 1 patch onto the skin daily. Remove & Discard patch within 12 hours or as directed by MD 01/23/20   Lenor Hollering, MD  lisinopril -hydrochlorothiazide  (PRINZIDE ,ZESTORETIC ) 20-25 MG tablet Take 1 tablet by mouth daily. 10/29/17   [provider]  lisinopril -hydrochlorothiazide  (ZESTORETIC ) 20-25 MG tablet Take 1 tablet by mouth daily. 06/24/23     lisinopril -hydrochlorothiazide  (ZESTORETIC ) 20-25 MG tablet Take 1 tablet by mouth daily. 07/25/23     lisinopril -hydrochlorothiazide  (ZESTORETIC ) 20-25 MG tablet Take 1 tablet by mouth daily. 09/14/23     lisinopril -hydrochlorothiazide  (ZESTORETIC ) 20-25 MG tablet Take 1 tablet by mouth daily. 11/28/23     loperamide  (IMODIUM  A-D) 2 MG tablet Take 1 tablet (2 mg total) by mouth 4  (four) times daily as needed for diarrhea or loose stools. 08/18/23   Long, Fonda MATSU, MD  meclizine  (ANTIVERT ) 25 MG tablet Take 1 tablet (25 mg total) by mouth 3 (three) times daily as needed for dizziness. 04/22/20   Horton, Charmaine FALCON, MD  meloxicam  (MOBIC ) 15 MG tablet TAKE 1 TABLET BY MOUTH EVERY DAY 05/25/19   Hudnall, Ludie SAUNDERS, MD  metoprolol tartrate (LOPRESSOR) 25 MG tablet SMARTSIG:.5 Tablet(s) By Mouth Twice Daily 04/30/19   [provider]  naloxone  (NARCAN ) nasal spray 4 mg/0.1 mL Place 1 spray into ONE nostril every 2 minutes as needed for opioid overdose. Alternate nostrils with each dose until help arrives. 07/25/23     ondansetron  (ZOFRAN ) 4 MG tablet Take 1 tablet (4 mg total) by mouth every 6 (six) hours.  06/16/23   Jerral Meth, MD  ondansetron  (ZOFRAN -ODT) 4 MG disintegrating tablet Take 1 tablet (4 mg total) by mouth every 8 (eight) hours as needed. 08/18/23   Long, Fonda MATSU, MD  oxyCODONE -acetaminophen  (PERCOCET/ROXICET) 5-325 MG tablet Take 1 tablet by mouth every 8 (eight) hours as needed for severe pain. 06/26/20   Patel, Shalyn, PA-C  pantoprazole  (PROTONIX ) 20 MG tablet Take 2 tablets (40 mg total) by mouth daily for 14 days. 09/12/20 09/26/20  Dreama Longs, MD  potassium chloride  SA (KLOR-CON  M) 20 MEQ tablet Take 2 tablets (40 mEq total) by mouth daily for 4 days. 08/18/23 08/22/23  LongFonda MATSU, MD  simvastatin  (ZOCOR ) 20 MG tablet Take 1 tablet by mouth daily 07/25/23     simvastatin  (ZOCOR ) 20 MG tablet Take 1 tablet by mouth daily 09/29/23     simvastatin  (ZOCOR ) 20 MG tablet Take 1 tablet (20 mg total) by mouth daily. 11/28/23       Allergies: Patient has no known allergies.    Review of Systems  All other systems reviewed and are negative.   Updated Vital Signs BP 117/73   Pulse 81   Temp 98.4 F (36.9 C) (Oral)   Resp 16   Ht 5' 4 (1.626 m)   Wt 76.2 kg   SpO2 100%   BMI 28.84 kg/m   Physical Exam Vitals and nursing note reviewed.   Constitutional:      General: She is not in acute distress.    Appearance: Normal appearance. She is normal weight. She is not ill-appearing, toxic-appearing or diaphoretic.  HENT:     Head: Normocephalic and atraumatic.  Cardiovascular:     Rate and Rhythm: Normal rate and regular rhythm.     Heart sounds: Normal heart sounds.  Pulmonary:     Effort: Pulmonary effort is normal. No respiratory distress.     Breath sounds: Normal breath sounds.  Abdominal:     General: Abdomen is flat.     Palpations: Abdomen is soft.     Tenderness: There is no abdominal tenderness.  Musculoskeletal:        General: Normal range of motion.     Cervical back: Normal range of motion.     Right lower leg: No edema.     Left lower leg: No edema.  Skin:    General: Skin is warm and dry.  Neurological:     General: No focal deficit present.     Mental Status: She is alert.  Psychiatric:        Mood and Affect: Mood normal.        Behavior: Behavior normal.     (all labs ordered are listed, but only abnormal results are displayed) Labs Reviewed  BASIC METABOLIC PANEL WITH GFR - Abnormal; Notable for the following components:      Result Value   Sodium 130 (*)    Chloride 91 (*)    Glucose, Bld 690 (*)    BUN 25 (*)    Creatinine, Ser 1.87 (*)    GFR, Estimated 30 (*)    Anion gap 16 (*)    All other components within normal limits  CBC - Abnormal; Notable for the following components:   Hemoglobin 15.1 (*)    All other components within normal limits  HEPATIC FUNCTION PANEL - Abnormal; Notable for the following components:   Alkaline Phosphatase 144 (*)    Bilirubin, Direct 0.3 (*)    All other components within normal limits  CBG MONITORING, ED - Abnormal; Notable for the following components:   Glucose-Capillary >600 (*)    All other components within normal limits  I-STAT VENOUS BLOOD GAS, ED - Abnormal; Notable for the following components:   pO2, Ven 30 (*)    Sodium 130 (*)     Hemoglobin 15.3 (*)    All other components within normal limits  CBG MONITORING, ED - Abnormal; Notable for the following components:   Glucose-Capillary 597 (*)    All other components within normal limits  URINALYSIS, ROUTINE W REFLEX MICROSCOPIC  BETA-HYDROXYBUTYRIC ACID    EKG: None  Radiology: No results found.   .Critical Care  Performed by: Nora Lauraine LABOR, PA-C Authorized by: Nahiara Kretzschmar A, PA-C   Critical care provider statement:    Critical care time (minutes):  35   Critical care was necessary to treat or prevent imminent or life-threatening deterioration of the following conditions:  Dehydration and endocrine crisis   Critical care was time spent personally by me on the following activities:  Development of treatment plan with patient or surrogate, evaluation of patient's response to treatment, examination of patient, obtaining history from patient or surrogate, ordering and review of laboratory studies, ordering and review of radiographic studies, pulse oximetry, re-evaluation of patient's condition and review of old charts   Care discussed with: admitting provider      Medications Ordered in the ED  insulin  regular, human (MYXREDLIN) 100 units/ 100 mL infusion (9.5 Units/hr Intravenous New Bag/Given 03/06/24 2043)  lactated ringers infusion (has no administration in time range)  dextrose  5 % in lactated ringers infusion (has no administration in time range)  dextrose  50 % solution 0-50 mL (has no administration in time range)  lactated ringers bolus 1,000 mL (1,000 mLs Intravenous New Bag/Given 03/06/24 1945)  lactated ringers bolus 1,000 mL (1,000 mLs Intravenous New Bag/Given 03/06/24 2054)    Clinical Course as of 03/09/24 1333  Tue Mar 06, 2024  2213 Dr. Marcene to accept patient in transfer for admission [BH]    Clinical Course User Index [BH] Henderly, Britni A, PA-C                                 Medical Decision Making Amount and/or Complexity of  Data Reviewed Labs: ordered.  Risk Prescription drug management. Decision regarding hospitalization.   This patient is a 63 y.o. female who presents to the ED for concern of hyperglycemia, this involves an extensive number of treatment options, and is a complaint that carries with it a high risk of complications and morbidity. The emergent differential diagnosis prior to evaluation includes, but is not limited to,  DKA/HHS . This is not an exhaustive differential.   Past Medical History / Co-morbidities / Social History:  has a past medical history of Chronic back pain greater than 3 months duration, Diabetes (HCC), Hypertension, and Neuropathy.  Additional history: Chart reviewed.  Physical Exam: Physical exam performed. The pertinent findings include: generally well appearing, abdomen soft and nontender, no acute physical exam abnormalities.  Lab Tests: I ordered, and personally interpreted labs.  The pertinent results include:  no leukocytosis, Na 130, chloride 91, glucose 690, BUN 25, creatinine 1.87 (up from 0.96 6 months ago), anion gap 16. pH WNL.    Medications: I ordered medication including IV fluids, insulin  via endotool  for AKI, hyperglycemia. Reevaluation of the patient after these medicines showed that the patient improved. I  have reviewed the patients home medicines and have made adjustments as needed.   Disposition: After consideration of the diagnostic results and the patients response to treatment, I feel that patient will require admission for hyperglycemia, AKI. No signs DKA/HHS at this time. Discussed plan with patient who is understanding and in agreement with this plan   Discussed patient with hospitalist Dr. Marcene who accepts patient for admission.  I discussed this case with my attending physician Dr. Doretha who cosigned this note including patient's presenting symptoms, physical exam, and planned diagnostics and interventions. Attending physician stated  agreement with plan or made changes to plan which were implemented.    Final diagnoses:  Type 2 diabetes mellitus with hyperglycemia, with long-term current use of insulin  (HCC)  AKI (acute kidney injury)    ED Discharge Orders     None          Nora Lauraine DELENA DEVONNA 03/09/24 1335    Doretha Folks, MD 03/14/24 1640

## 2024-03-06 NOTE — ED Notes (Signed)
 Pt stated that she attempted to provide urine sample but was unable to void. Instructed to provide urine sample as soon as she is able. Pt verbalized understanding.

## 2024-03-07 ENCOUNTER — Other Ambulatory Visit (HOSPITAL_BASED_OUTPATIENT_CLINIC_OR_DEPARTMENT_OTHER): Payer: Self-pay

## 2024-03-07 LAB — CBG MONITORING, ED
Glucose-Capillary: 158 mg/dL — ABNORMAL HIGH (ref 70–99)
Glucose-Capillary: 172 mg/dL — ABNORMAL HIGH (ref 70–99)
Glucose-Capillary: 176 mg/dL — ABNORMAL HIGH (ref 70–99)
Glucose-Capillary: 180 mg/dL — ABNORMAL HIGH (ref 70–99)
Glucose-Capillary: 180 mg/dL — ABNORMAL HIGH (ref 70–99)
Glucose-Capillary: 184 mg/dL — ABNORMAL HIGH (ref 70–99)
Glucose-Capillary: 205 mg/dL — ABNORMAL HIGH (ref 70–99)

## 2024-03-07 LAB — BASIC METABOLIC PANEL WITH GFR
Anion gap: 10 (ref 5–15)
BUN: 19 mg/dL (ref 8–23)
CO2: 29 mmol/L (ref 22–32)
Calcium: 9.4 mg/dL (ref 8.9–10.3)
Chloride: 101 mmol/L (ref 98–111)
Creatinine, Ser: 1.21 mg/dL — ABNORMAL HIGH (ref 0.44–1.00)
GFR, Estimated: 50 mL/min — ABNORMAL LOW (ref >60)
Glucose, Bld: 165 mg/dL — ABNORMAL HIGH (ref 70–99)
Potassium: 3.1 mmol/L — ABNORMAL LOW (ref 3.5–5.1)
Sodium: 140 mmol/L (ref 135–145)

## 2024-03-07 MED ORDER — POTASSIUM CHLORIDE CRYS ER 20 MEQ PO TBCR
40.0000 meq | EXTENDED_RELEASE_TABLET | Freq: Every day | ORAL | 0 refills | Status: AC
  Filled 2024-03-07: qty 8, 4d supply, fill #0

## 2024-03-07 MED ORDER — INSULIN ASPART 100 UNIT/ML IJ SOLN
0.0000 [IU] | Freq: Three times a day (TID) | INTRAMUSCULAR | Status: DC
  Administered 2024-03-07: 3 [IU] via SUBCUTANEOUS

## 2024-03-07 MED ORDER — INSULIN ASPART 100 UNIT/ML IJ SOLN
INTRAMUSCULAR | Status: DC
Start: 2024-03-07 — End: 2024-03-07
  Filled 2024-03-07: qty 3

## 2024-03-07 MED ORDER — INSULIN ASPART 100 UNIT/ML IJ SOLN: 4.0000 [IU] | Freq: Three times a day (TID) | INTRAMUSCULAR | Status: DC

## 2024-03-07 MED ORDER — INSULIN ASPART 100 UNIT/ML IJ SOLN: 0.0000 [IU] | Freq: Every day | INTRAMUSCULAR | Status: DC

## 2024-03-07 MED ORDER — INSULIN GLARGINE-YFGN 100 UNIT/ML ~~LOC~~ SOLN
14.0000 [IU] | SUBCUTANEOUS | Status: DC
  Administered 2024-03-07: 14 [IU] via SUBCUTANEOUS
  Filled 2024-03-07: qty 140

## 2024-03-07 NOTE — Discharge Instructions (Signed)
 You were seen in the emerged from today with elevated blood sugars.  We were able to treat you with insulin  overnight and fluids and your labs have improved.  I would like for you to call your endocrinologist and/or primary care doctor to discuss your ED stay and also discuss possibly changing your insulin .  Please return with any new or suddenly worsening symptoms.  Your potassium is slightly low and I have sent several days of potassium tablets to your pharmacy.

## 2024-03-07 NOTE — ED Provider Notes (Signed)
 CBG has improved, will initiate the Transition from Endotool order set, giving 2/3 of her usual Long Acting insulin  as well as SSI.    Roselyn Carlin NOVAK, MD 03/07/24 [phone]

## 2024-03-07 NOTE — ED Provider Notes (Signed)
 Blood pressure 124/69, pulse 76, temperature 98.5 F (36.9 C), temperature source Oral, resp. rate 19, height 5' 4 (1.626 m), weight 76.2 kg, SpO2 100%.    In short, Ellinor Test is a 64 y.o. female with a chief complaint of Hyperglycemia and Dizziness .  Refer to the original H&P for additional details.  08:30 AM  Patient has been weaned off of the insulin  infusion overnight.  She is eating breakfast and feeling much better.  She has insulin  at home and plans to call her prescribing physician today for close follow-up.  She states it is her strong preference to return home this morning rather than continuing with plan for admit, which seems reasonable given the clinical presentation this AM.     Jarel Cuadra, Fonda MATSU, MD 03/07/24 [phone]

## 2024-03-07 NOTE — ED Notes (Signed)
 CBC not needed per EDP.

## 2024-03-08 ENCOUNTER — Other Ambulatory Visit (HOSPITAL_BASED_OUTPATIENT_CLINIC_OR_DEPARTMENT_OTHER): Payer: Self-pay

## 2024-03-08 ENCOUNTER — Other Ambulatory Visit: Payer: Self-pay

## 2024-03-08 MED ORDER — DEXCOM G7 RECEIVER DEVI
1.0000 | 0 refills | Status: AC
  Filled 2024-03-08: qty 1, 30d supply, fill #0

## 2024-03-08 MED ORDER — INSULIN LISPRO (1 UNIT DIAL) 100 UNIT/ML (KWIKPEN)
2.0000 [IU] | PEN_INJECTOR | Freq: Every day | SUBCUTANEOUS | 0 refills | Status: AC
  Filled 2024-03-08: qty 3, 25d supply, fill #0

## 2024-03-08 MED ORDER — CELECOXIB 100 MG PO CAPS
100.0000 mg | ORAL_CAPSULE | Freq: Two times a day (BID) | ORAL | 1 refills | Status: AC
  Filled 2024-03-08: qty 180, 90d supply, fill #0

## 2024-03-08 MED ORDER — TRULICITY 3 MG/0.5ML ~~LOC~~ SOAJ
3.0000 mg | SUBCUTANEOUS | 0 refills | Status: AC
  Filled 2024-03-08: qty 2, 28d supply, fill #0

## 2024-03-08 MED ORDER — DEXCOM G7 SENSOR MISC
0 refills | Status: AC
  Filled 2024-03-08: qty 9, 90d supply, fill #0

## 2024-03-08 MED ORDER — INSULIN GLARGINE 100 UNIT/ML ~~LOC~~ SOLN
30.0000 [IU] | Freq: Every day | SUBCUTANEOUS | 3 refills | Status: AC
  Filled 2024-03-08: qty 10, 28d supply, fill #0
  Filled 2024-05-25: qty 10, 28d supply, fill #1

## 2024-03-08 MED ORDER — CYCLOBENZAPRINE HCL 10 MG PO TABS
10.0000 mg | ORAL_TABLET | Freq: Three times a day (TID) | ORAL | 0 refills | Status: AC
  Filled 2024-03-08: qty 270, 90d supply, fill #0

## 2024-03-08 MED ORDER — HYDROCODONE-ACETAMINOPHEN 10-325 MG PO TABS
1.0000 | ORAL_TABLET | Freq: Four times a day (QID) | ORAL | 0 refills | Status: DC | PRN
  Filled 2024-03-08 – 2024-03-16 (×2): qty 120, 30d supply, fill #0

## 2024-03-08 MED ORDER — INSULIN SYRINGE-NEEDLE U-100 30G X 5/16" 0.5 ML MISC
1.0000 | Freq: Every day | 2 refills | Status: AC
  Filled 2024-03-08: qty 100, 90d supply, fill #0

## 2024-03-09 ENCOUNTER — Other Ambulatory Visit (HOSPITAL_BASED_OUTPATIENT_CLINIC_OR_DEPARTMENT_OTHER): Payer: Self-pay

## 2024-03-12 ENCOUNTER — Other Ambulatory Visit (HOSPITAL_BASED_OUTPATIENT_CLINIC_OR_DEPARTMENT_OTHER): Payer: Self-pay

## 2024-03-12 MED ORDER — COMFORT EZ PEN NEEDLES 32G X 4 MM MISC
0 refills | Status: AC
  Filled 2024-03-12: qty 100, 25d supply, fill #0

## 2024-03-16 ENCOUNTER — Other Ambulatory Visit (HOSPITAL_BASED_OUTPATIENT_CLINIC_OR_DEPARTMENT_OTHER): Payer: Self-pay

## 2024-04-06 ENCOUNTER — Other Ambulatory Visit: Payer: Self-pay

## 2024-04-06 ENCOUNTER — Other Ambulatory Visit (HOSPITAL_BASED_OUTPATIENT_CLINIC_OR_DEPARTMENT_OTHER): Payer: Self-pay

## 2024-04-06 MED ORDER — GABAPENTIN 600 MG PO TABS
600.0000 mg | ORAL_TABLET | Freq: Three times a day (TID) | ORAL | 0 refills | Status: AC
  Filled 2024-04-06 – 2024-04-20 (×4): qty 270, 90d supply, fill #0

## 2024-04-06 MED ORDER — HYDROCODONE-ACETAMINOPHEN 10-325 MG PO TABS
1.0000 | ORAL_TABLET | Freq: Four times a day (QID) | ORAL | 0 refills | Status: DC | PRN
  Filled 2024-04-19: qty 120, 30d supply, fill #0

## 2024-04-16 ENCOUNTER — Other Ambulatory Visit (HOSPITAL_BASED_OUTPATIENT_CLINIC_OR_DEPARTMENT_OTHER): Payer: Self-pay

## 2024-04-18 ENCOUNTER — Other Ambulatory Visit (HOSPITAL_COMMUNITY): Payer: Self-pay

## 2024-04-19 ENCOUNTER — Other Ambulatory Visit (HOSPITAL_BASED_OUTPATIENT_CLINIC_OR_DEPARTMENT_OTHER): Payer: Self-pay

## 2024-04-20 ENCOUNTER — Other Ambulatory Visit (HOSPITAL_BASED_OUTPATIENT_CLINIC_OR_DEPARTMENT_OTHER): Payer: Self-pay

## 2024-04-23 ENCOUNTER — Other Ambulatory Visit (HOSPITAL_COMMUNITY): Payer: Self-pay

## 2024-05-25 ENCOUNTER — Other Ambulatory Visit (HOSPITAL_BASED_OUTPATIENT_CLINIC_OR_DEPARTMENT_OTHER): Payer: Self-pay

## 2024-05-25 MED ORDER — LISINOPRIL-HYDROCHLOROTHIAZIDE 20-25 MG PO TABS
1.0000 | ORAL_TABLET | Freq: Every day | ORAL | 1 refills | Status: AC
  Filled 2024-05-25: qty 90, 90d supply, fill #0

## 2024-05-25 MED ORDER — HYDROCODONE-ACETAMINOPHEN 10-325 MG PO TABS
1.0000 | ORAL_TABLET | Freq: Four times a day (QID) | ORAL | 0 refills | Status: AC | PRN
  Filled 2024-05-25: qty 120, 30d supply, fill #0
